# Patient Record
Sex: Female | Born: 1945
Health system: Southern US, Community
[De-identification: ages and names within clinical notes are randomized; demographics above are authoritative.]

## PROBLEM LIST (undated history)

## (undated) DIAGNOSIS — I499 Cardiac arrhythmia, unspecified: Secondary | ICD-10-CM

## (undated) DIAGNOSIS — I208 Other forms of angina pectoris: Secondary | ICD-10-CM

## (undated) DIAGNOSIS — I341 Nonrheumatic mitral (valve) prolapse: Secondary | ICD-10-CM

## (undated) DIAGNOSIS — M810 Age-related osteoporosis without current pathological fracture: Secondary | ICD-10-CM

## (undated) DIAGNOSIS — D229 Melanocytic nevi, unspecified: Secondary | ICD-10-CM

## (undated) DIAGNOSIS — Z86010 Personal history of colonic polyps: Secondary | ICD-10-CM

## (undated) DIAGNOSIS — C801 Malignant (primary) neoplasm, unspecified: Secondary | ICD-10-CM

## (undated) HISTORY — DX: Nonrheumatic mitral (valve) prolapse: I34.1

## (undated) HISTORY — PX: COLONOSCOPY: SHX174

## (undated) HISTORY — DX: Personal history of colonic polyps: Z86.010

## (undated) HISTORY — PX: TONSILLECTOMY: SUR1361

## (undated) HISTORY — DX: Melanocytic nevi, unspecified: D22.9

## (undated) HISTORY — PX: POLYPECTOMY: SHX149

## (undated) HISTORY — DX: Cardiac arrhythmia, unspecified: I49.9

## (undated) HISTORY — DX: Other forms of angina pectoris: I20.8

## (undated) HISTORY — DX: Age-related osteoporosis without current pathological fracture: M81.0

---

## 1993-03-12 HISTORY — PX: BREAST BIOPSY: SHX20

## 1998-08-02 ENCOUNTER — Other Ambulatory Visit: Admission: RE | Admit: 1998-08-02 | Discharge: 1998-08-02 | Payer: Self-pay | Admitting: Obstetrics and Gynecology

## 1999-04-07 ENCOUNTER — Encounter (INDEPENDENT_AMBULATORY_CARE_PROVIDER_SITE_OTHER): Payer: Self-pay

## 1999-04-07 ENCOUNTER — Other Ambulatory Visit: Admission: RE | Admit: 1999-04-07 | Discharge: 1999-04-07 | Payer: Self-pay | Admitting: Obstetrics and Gynecology

## 1999-09-05 ENCOUNTER — Other Ambulatory Visit: Admission: RE | Admit: 1999-09-05 | Discharge: 1999-09-05 | Payer: Self-pay | Admitting: Obstetrics and Gynecology

## 2000-04-15 ENCOUNTER — Ambulatory Visit (HOSPITAL_COMMUNITY): Admission: RE | Admit: 2000-04-15 | Discharge: 2000-04-15 | Payer: Self-pay | Admitting: Gastroenterology

## 2000-10-01 ENCOUNTER — Other Ambulatory Visit: Admission: RE | Admit: 2000-10-01 | Discharge: 2000-10-01 | Payer: Self-pay | Admitting: Obstetrics and Gynecology

## 2001-10-27 ENCOUNTER — Encounter: Payer: Self-pay | Admitting: Cardiology

## 2001-10-27 ENCOUNTER — Emergency Department (HOSPITAL_COMMUNITY): Admission: EM | Admit: 2001-10-27 | Discharge: 2001-10-27 | Payer: Self-pay | Admitting: Emergency Medicine

## 2001-11-17 ENCOUNTER — Other Ambulatory Visit: Admission: RE | Admit: 2001-11-17 | Discharge: 2001-11-17 | Payer: Self-pay | Admitting: Obstetrics and Gynecology

## 2002-11-23 ENCOUNTER — Other Ambulatory Visit: Admission: RE | Admit: 2002-11-23 | Discharge: 2002-11-23 | Payer: Self-pay | Admitting: Obstetrics and Gynecology

## 2005-08-17 DIAGNOSIS — I2089 Other forms of angina pectoris: Secondary | ICD-10-CM

## 2005-08-17 DIAGNOSIS — I208 Other forms of angina pectoris: Secondary | ICD-10-CM

## 2005-08-17 HISTORY — DX: Other forms of angina pectoris: I20.8

## 2005-08-17 HISTORY — DX: Other forms of angina pectoris: I20.89

## 2007-10-22 DIAGNOSIS — D229 Melanocytic nevi, unspecified: Secondary | ICD-10-CM

## 2007-10-22 HISTORY — DX: Melanocytic nevi, unspecified: D22.9

## 2009-02-09 DIAGNOSIS — I499 Cardiac arrhythmia, unspecified: Secondary | ICD-10-CM

## 2009-02-09 HISTORY — DX: Cardiac arrhythmia, unspecified: I49.9

## 2009-07-18 ENCOUNTER — Encounter (INDEPENDENT_AMBULATORY_CARE_PROVIDER_SITE_OTHER): Payer: Self-pay | Admitting: *Deleted

## 2009-07-20 ENCOUNTER — Encounter (INDEPENDENT_AMBULATORY_CARE_PROVIDER_SITE_OTHER): Payer: Self-pay | Admitting: *Deleted

## 2009-07-21 ENCOUNTER — Ambulatory Visit: Payer: Self-pay | Admitting: Internal Medicine

## 2009-08-18 ENCOUNTER — Ambulatory Visit: Payer: Self-pay | Admitting: Internal Medicine

## 2009-08-21 ENCOUNTER — Encounter: Payer: Self-pay | Admitting: Internal Medicine

## 2009-08-25 ENCOUNTER — Telehealth: Payer: Self-pay | Admitting: Internal Medicine

## 2010-02-13 ENCOUNTER — Encounter: Admission: RE | Admit: 2010-02-13 | Discharge: 2010-02-13 | Payer: Self-pay | Admitting: Family Medicine

## 2010-04-11 NOTE — Letter (Signed)
Summary: Moviprep Instructions  [facility]  [address]   [place], [place] [address]   Phone: [phone]  Fax: [phone]       [name]    03-28-1945    MRN: [id number]        Procedure Day Dorna Bloom: Thursday, 08-18-09     Arrival Time: 8:30 a.m.      Procedure Time: 9:30 a.m.     Location of Procedure:                    x   [facility] (4th Floor)                        PREPARATION FOR COLONOSCOPY WITH MOVIPREP   Starting 5 days prior to your procedure 08-13-09  do not eat nuts, seeds, popcorn, corn, beans, peas,  salads, or any raw vegetables.  Do not take any fiber supplements (e.g. Metamucil, Citrucel, and Benefiber).  THE DAY BEFORE YOUR PROCEDURE         DATE: 08-17-09   DAY: Wednesday  1.  Drink clear liquids the entire day-NO SOLID FOOD  2.  Do not drink anything colored red or purple.  Avoid juices with pulp.  No orange juice.  3.  Drink at least 64 oz. (8 glasses) of fluid/clear liquids during the day to prevent dehydration and help the prep work efficiently.  CLEAR LIQUIDS INCLUDE: Water Jello Ice Popsicles Tea (sugar ok, no milk/cream) Powdered fruit flavored drinks Coffee (sugar ok, no milk/cream) Gatorade Juice: apple, white grape, white cranberry  Lemonade Clear bullion, consomm, broth Carbonated beverages (any kind) Strained chicken noodle soup Hard Candy                             4.  In the morning, mix first dose of MoviPrep solution:    Empty 1 Pouch A and 1 Pouch B into the disposable container    Add lukewarm drinking water to the top line of the container. Mix to dissolve    Refrigerate (mixed solution should be used within 24 hrs)  5.  Begin drinking the prep at 5:00 p.m. The MoviPrep container is divided by 4 marks.   Every 15 minutes drink the solution down to the next mark (approximately 8 oz) until the full liter is complete.   6.  Follow completed prep with 16 oz of clear liquid of your choice  (Nothing red or purple).  Continue to drink clear liquids until bedtime.  7.  Before going to bed, mix second dose of MoviPrep solution:    Empty 1 Pouch A and 1 Pouch B into the disposable container    Add lukewarm drinking water to the top line of the container. Mix to dissolve    Refrigerate THE DAY OF YOUR PROCEDURE      DATE: 08-18-09   DAY:  Thursday  Beginning at 4:30 a.m. (5 hours before procedure):         1. Every 15 minutes, drink the solution down to the next mark (approx 8 oz) until the full liter is complete.  2. Follow completed prep with 16 oz. of clear liquid of your choice.    3. You may drink clear liquids until  6:30 a.m.  (2 HOURS BEFORE PROCEDURE).   MEDICATION INSTRUCTIONS  Unless otherwise instructed, you should take regular prescription medications with a small sip of water  as early as possible the morning of your procedure.           OTHER INSTRUCTIONS  You will need a responsible adult at least 67 years of age to accompany you and drive you home.   This person must remain in the waiting room during your procedure.  Wear loose fitting clothing that is easily removed.  Leave jewelry and other valuables at home.  However, you may wish to bring a book to read or  an iPod/MP3 player to listen to music as you wait for your procedure to start.  Remove all body piercing jewelry and leave at home.  Total time from sign-in until discharge is approximately 2-3 hours.  You should go home directly after your procedure and rest.  You can resume normal activities the  day after your procedure.  The day of your procedure you should not:   Drive   Make legal decisions   Operate machinery   Drink alcohol   Return to work  You will receive specific instructions about eating, activities and medications before you leave.    The above instructions have been reviewed and explained to me by   Clide Cliff, RN______________________    I fully  understand and can verbalize these instructions _____________________________ Date _________

## 2010-04-11 NOTE — Miscellaneous (Signed)
Summary: previsit  Clinical Lists Changes  Medications: Added new medication of MOVIPREP 100 GM  SOLR (PEG-KCL-NACL-NASULF-NA ASC-C) As directed - Signed Rx of MOVIPREP 100 GM  SOLR (PEG-KCL-NACL-NASULF-NA ASC-C) As directed;  #1 x 0;  Signed;  Entered by: Clide Cliff RN;  Authorized by: Iva Boop MD, [facility];  Method used: Electronically to [facility]  [id number]*, [address] [place], [place], [place]  [address], Ph: [phone] or [phone], Fax: [phone] Observations: Added new observation of ALLERGY REV: Done (07/21/2009 15:37)    Prescriptions: MOVIPREP 100 GM  SOLR (PEG-KCL-NACL-NASULF-NA ASC-C) As directed  #1 x 0   Entered by:   Clide Cliff RN   Authorized by:   Iva Boop MD, [facility]   Signed by:   Clide Cliff RN on 07/21/2009   Method used:   Electronically to        [facility]  [phone]* (retail)       [address]       [place], [place]  [address]       Ph: [phone] or [phone]       Fax: [phone]   RxID:   [id number]

## 2010-04-11 NOTE — Procedures (Signed)
Summary: Colonoscopy  Patient: [name] Note: All result statuses are Final unless otherwise noted.  Tests: (1) Colonoscopy (COL)   COL Colonoscopy           DONE     [facility]     [address]     [place], [place]  [address]           COLONOSCOPY PROCEDURE REPORT           PATIENT:  Kristina, [name]  MR#:  [id number]     BIRTHDATE:  15-Aug-1945, 63 yrs. old  GENDER:  female     ENDOSCOPIST:  Iva Boop, MD, [facility]     REF. BY:  Merri Brunette, M.D.     PROCEDURE DATE:  08/18/2009     PROCEDURE:  Colonoscopy 19[address]     ASA CLASS:  Class II     INDICATIONS:  Routine Risk Screening     MEDICATIONS:   Fentanyl 50 mcg IV, Versed 5 mg IV           DESCRIPTION OF PROCEDURE:   After the risks benefits and     alternatives of the procedure were thoroughly explained, informed     consent was obtained.  Digital rectal exam was performed and     revealed no rectal masses and decreased sphincter tone.   The LB     [id number]-[id number] endoscope was introduced through the anus and     advanced to the cecum, which was identified by both the appendix     and ileocecal valve, without limitations.  The quality of the prep     was excellent, using MoviPrep.  The instrument was then slowly     withdrawn as the colon was fully examined.     Insertion: 5:42 minutes Withdrawal: 11:31 minutes     <<PROCEDUREIMAGES>>           FINDINGS:  A diminutive polyp was found in the ascending colon. It     was 3 - 4 mm in size. Seen on right colon retroflexion. Polyp was     snared without cautery. Retrieval was successful.  This was     otherwise a normal examination of the colon. The colon was     somewhat redundant.   Retroflexed views in the rectum revealed     internal hemorrhoids.    The scope was then withdrawn from the     patient and the procedure completed.           COMPLICATIONS:  None     ENDOSCOPIC IMPRESSION:     1) 3 - 4 mm diminutive polyp in the ascending colon - removed   2) Internal hemorrhoids     3) Otherwise normal examination, excellemt prep           REPEAT EXAM:  In for Colonoscopy, pending biopsy results.           Iva Boop, MD, [facility]           CC:  Merri Brunette, MD and The Patient           n.     eSIGNED:   Iva Boop at 08/18/2009 10:36 AM           Morrell Riddle, [id number]  Note: An exclamation mark (!) indicates a result that was not dispersed into the flowsheet. Document Creation Date: 08/18/2009 10:37 AM _______________________________________________________________________  (1) Order result status: Final Collection or observation date-time: 08/18/2009 10:28  Requested date-time:  Receipt date-time:  Reported date-time:  Referring Physician:   Ordering Physician: Stan Head [id number]) Specimen Source:  Source: Launa Grill Order Number: [id number] Lab site:   Appended Document: Colonoscopy colon recall 5 yrs

## 2010-04-11 NOTE — Progress Notes (Signed)
Summary: Results of her Colon done 08-18-09  Phone Note Call from Patient Call back at Work Phone [phone]   Call For: Dr Leone Payor Summary of Call: Wants her results of the polyp Initial call taken by: Leanor Kail [facility],  August 25, 2009 8:27 AM  Follow-up for Phone Call        given path. results. Follow-up by: Teryl Lucy RN,  August 25, 2009 8:38 AM

## 2010-04-11 NOTE — Letter (Signed)
Summary: Patient Notice- Polyp Results   Gastroenterology  [address] [place], [place] [address]   Phone: [phone]  Fax: [phone]        August 21, 2009 MRN: [id number]    [name] [address] [place], [place]  [address]    Dear Ms. Kutch,  The polyp removed from your colon was adenomatous. This means that it was pre-cancerous or that  it had the potential to change into cancer over time.  I recommend that you have a repeat colonoscopy in 5 years to determine if you have developed any new polyps over time. If you develop any new rectal bleeding, abdominal pain or significant bowel habit changes, please contact us before then.  Please call us if you are having persistent problems or have questions about your condition that have not been fully answered at this time.  Sincerely,  Iva Boop MD, [facility]  This letter has been electronically signed by your physician.  Appended Document: Patient Notice- Polyp Results letter mailed.

## 2010-04-11 NOTE — Letter (Signed)
Summary: Previsit letter  [facility]  [address] [place], [place] [address]   Phone: [phone]  Fax: [phone]       07/18/2009 MRN: [id number]  [facility] [address] [place], [place]  [address]  Dear [name],  Welcome to the Gastroenterology Division at [facility]    You are scheduled to see a nurse for your pre-procedure visit on 07/21/2009 at 4:00PM on the 3rd floor at [facility], [address]  We ask that you try to arrive at our office 15 minutes prior to your appointment time to allow for check-in.  Your nurse visit will consist of discussing your medical and surgical history, your immediate family medical history, and your medications.    Please bring a complete list of all your medications or, if you prefer, bring the medication bottles and we will list them.  We will need to be aware of both prescribed and over the counter drugs.  We will need to know exact dosage information as well.  If you are on blood thinners (Coumadin, Plavix, Aggrenox, Ticlid, etc.) please call our office today/prior to your appointment, as we need to consult with your physician about holding your medication.   Please be prepared to read and sign documents such as consent forms, a financial agreement, and acknowledgement forms.  If necessary, and with your consent, a friend or relative is welcome to sit-in on the nurse visit with you.  Please bring your insurance card so that we may make a copy of it.  If your insurance requires a referral to see a specialist, please bring your referral form from your primary care physician.  No co-pay is required for this nurse visit.     If you cannot keep your appointment, please call [phone] to cancel or reschedule prior to your appointment date.  This allows [place] the opportunity to schedule an appointment for another patient in need of care.    Thank you for choosing [facility] for your medical  needs.  We appreciate the opportunity to care for you.  Please visit [place] at our website  to learn more about our practice.                     Sincerely.                                                                                                                   The Gastroenterology Division

## 2010-07-28 NOTE — Procedures (Signed)
[place]  Patient:    [name], [name]                        MRN: [id number] Proc. Date: 04/15/00 Attending:  Verlin Grills, M.D. CC:         Eliberto Ivory. Rosalio Macadamia, M.D.   Procedure Report  DATE OF BIRTH:  1945-08-11  REFERRING PHYSICIAN:  PROCEDURE PERFORMED:  Colonoscopy.  ENDOSCOPIST:  Verlin Grills, M.D.  INDICATIONS FOR PROCEDURE:  The patient is a 65 year old female.  She underwent her health maintenance gynecologic exam performed by Dr. Floyde Parkins.  Her stool was Hemoccult positive.  I discussed with the patient the complications associated with colonoscopy and polypectomy including a 1 per 1000 risk of bleeding and 4 per 1000 risk of colon rupture requiring emergency surgery.  The patient has signed the operative permit.  PREMEDICATION:  Demerol 50 mg, Versed 7 mg.  ENDOSCOPE:  Olympus pediatric colonoscope.  DESCRIPTION OF PROCEDURE:  After obtaining informed consent, the patient was placed in the left lateral decubitus position.  I administered intravenous Demerol and intravenous Versed to achieve sedation for the procedure.  The patients blood pressure, oxygen saturation and cardiac rhythm were monitored throughout the procedure and documented in the medical record.  Anal inspection was normal.  Digital rectal exam was normal.  The Olympus pediatric video colonoscope was then introduced into the rectum and under direct vision, advanced to the cecum as identified by a normal-appearing ileocecal valve.  Colonic preparation for the exam today was excellent.  Rectum:  Normal.  Sigmoid colon:  Normal.  Descending colon:  Normal.  Splenic flexure:  Normal.  Transverse colon:  Normal.  Hepatic flexure:  Normal.  Ascending colon:  Normal.  Cecum and ileocecal valve:  Normal.  ASSESSMENT:  Normal proctocolonoscopy to the cecum.  I find no endoscopic evidence for the presence of colorectal  neoplasia.  RECOMMENDATIONS:  Repeat colonoscopy in approximately 10 years. DD:  04/15/00 TD:  04/15/00 Job: [id number]

## 2011-06-19 DIAGNOSIS — N6009 Solitary cyst of unspecified breast: Secondary | ICD-10-CM | POA: Diagnosis not present

## 2011-06-21 DIAGNOSIS — R002 Palpitations: Secondary | ICD-10-CM | POA: Diagnosis not present

## 2011-06-21 DIAGNOSIS — I498 Other specified cardiac arrhythmias: Secondary | ICD-10-CM | POA: Diagnosis not present

## 2011-08-29 DIAGNOSIS — T148 Other injury of unspecified body region: Secondary | ICD-10-CM | POA: Diagnosis not present

## 2011-08-29 DIAGNOSIS — W57XXXA Bitten or stung by nonvenomous insect and other nonvenomous arthropods, initial encounter: Secondary | ICD-10-CM | POA: Diagnosis not present

## 2011-10-11 DIAGNOSIS — M7750 Other enthesopathy of unspecified foot: Secondary | ICD-10-CM | POA: Diagnosis not present

## 2011-12-27 DIAGNOSIS — H43819 Vitreous degeneration, unspecified eye: Secondary | ICD-10-CM | POA: Diagnosis not present

## 2011-12-27 DIAGNOSIS — H2510 Age-related nuclear cataract, unspecified eye: Secondary | ICD-10-CM | POA: Diagnosis not present

## 2011-12-27 DIAGNOSIS — D3130 Benign neoplasm of unspecified choroid: Secondary | ICD-10-CM | POA: Diagnosis not present

## 2012-02-18 DIAGNOSIS — Z23 Encounter for immunization: Secondary | ICD-10-CM | POA: Diagnosis not present

## 2012-02-18 DIAGNOSIS — I498 Other specified cardiac arrhythmias: Secondary | ICD-10-CM | POA: Diagnosis not present

## 2012-02-18 DIAGNOSIS — Z136 Encounter for screening for cardiovascular disorders: Secondary | ICD-10-CM | POA: Diagnosis not present

## 2012-02-18 DIAGNOSIS — Z1331 Encounter for screening for depression: Secondary | ICD-10-CM | POA: Diagnosis not present

## 2012-02-18 DIAGNOSIS — Z0000 Encounter for general adult medical examination without abnormal findings: Secondary | ICD-10-CM | POA: Diagnosis not present

## 2012-05-04 ENCOUNTER — Encounter: Payer: Self-pay | Admitting: *Deleted

## 2012-07-01 DIAGNOSIS — I498 Other specified cardiac arrhythmias: Secondary | ICD-10-CM | POA: Diagnosis not present

## 2012-07-10 DIAGNOSIS — Z124 Encounter for screening for malignant neoplasm of cervix: Secondary | ICD-10-CM | POA: Diagnosis not present

## 2012-07-10 DIAGNOSIS — Z01419 Encounter for gynecological examination (general) (routine) without abnormal findings: Secondary | ICD-10-CM | POA: Diagnosis not present

## 2012-09-15 DIAGNOSIS — N6489 Other specified disorders of breast: Secondary | ICD-10-CM | POA: Diagnosis not present

## 2013-07-02 ENCOUNTER — Ambulatory Visit (INDEPENDENT_AMBULATORY_CARE_PROVIDER_SITE_OTHER): Payer: Medicare Other | Admitting: Cardiovascular Disease

## 2013-07-02 ENCOUNTER — Encounter: Payer: Self-pay | Admitting: Cardiovascular Disease

## 2013-07-02 VITALS — BP 108/62 | HR 59 | Ht 67.0 in | Wt <= 1120 oz

## 2013-07-02 DIAGNOSIS — Z13220 Encounter for screening for lipoid disorders: Secondary | ICD-10-CM | POA: Diagnosis not present

## 2013-07-02 DIAGNOSIS — I471 Supraventricular tachycardia: Secondary | ICD-10-CM

## 2013-07-02 DIAGNOSIS — I059 Rheumatic mitral valve disease, unspecified: Secondary | ICD-10-CM | POA: Diagnosis not present

## 2013-07-02 DIAGNOSIS — I340 Nonrheumatic mitral (valve) insufficiency: Secondary | ICD-10-CM

## 2013-07-02 DIAGNOSIS — I341 Nonrheumatic mitral (valve) prolapse: Secondary | ICD-10-CM

## 2013-07-02 DIAGNOSIS — R002 Palpitations: Secondary | ICD-10-CM | POA: Diagnosis not present

## 2013-07-02 MED ORDER — METOPROLOL SUCCINATE ER 25 MG PO TB24: 25.0000 mg | ORAL_TABLET | Freq: Every day | ORAL | Status: DC

## 2013-07-02 NOTE — Progress Notes (Signed)
Patient ID: [name], female   DOB: 02/25/1946, 68 y.o.   MRN: [id number]     HPI: [name] is a 68 y.o. female who presents for 1 year cardiology evaluation.  [name] has a history of supraventricular tachycardia and has been treated with beta blocker therapy with Toprol-XL 37.5 mg daily.  An echo Doppler study in 2010 showed normal systolic function.  She did have mild mitral regurgitation.  Over the past year, she has remained fairly active.  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  She denies shortness of breath.  She is unaware of recurrent arrhythmia.  She denies chest pressure.  She presents for evaluation.  She has not had laboratories done in over a year.  Past Medical History  Diagnosis Date  . Dysrhythmia 02/09/2009    history of SVT ; Echo normal systolic and diastolic function ,XQ=>11%  . Atypical angina 08/17/2005    Exercise stress test-EF74% ,normal perfusion  . Mitral valve prolapse     history mild MVP    History reviewed. No pertinent past surgical history.  No Known Allergies  Current Outpatient Prescriptions  Medication Sig Dispense Refill  . Calcium-Vitamin D-Vitamin K (VIACTIV PO) Take by mouth.      . cholecalciferol (VITAMIN D) 1000 UNITS tablet Take 1,000 Units by mouth daily.      . metoprolol succinate (TOPROL-XL) 25 MG 24 hr tablet Take 1 tablet (25 mg total) by mouth daily. Take 1 and 1/2 tablets by mouth  daily  135 tablet  11   No current facility-administered medications for this visit.    History   Social History  . Marital Status: Married    Spouse Name: N/A    Number of Children: 2  . Years of Education: N/A   Occupational History  . Not on file.   Social History Main Topics  . Smoking status: Never Smoker   . Smokeless tobacco: Never Used  . Alcohol Use: Yes     Comment: occas glass of wine  . Drug Use: Not on file  . Sexual Activity: Not on file   Other Topics Concern  . Not on file   Social  History Narrative  . 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  She is married, has 2 children.  She does exercise fairly regularly.  She drinks occasional alcohol.  No tobacco use.  Family History  Problem Relation Age of Onset  . Heart disease Father     ROS is negative for fevers, chills or night sweats.  She denies change in skin.  She denies vision changes or hearing loss.  There is no lymphadenopathy.  She is unaware of wheezing.  She denies shortness of breath.  There is no PND or orthopnea.  She denies pre-syncope or syncope.  She is unaware of palpitations.  There is no chest pressure.  He denies nausea, vomiting, or diarrhea.  She denies blood in stool or urine.  She denies claudication.  There is no edema.  She denies neurologic symptoms.  She denies musculoskeletal symptoms.  There is no diabetes or thyroid disease. Other comprehensive 14 point system review is negative.  PE BP 108/62  Pulse 59  Ht 5\' 7"  (1.702 m)  Wt 13 lb 1.6 oz (5.942 kg)  BMI 2.05 kg/m2  General: Alert, oriented, no distress.  Skin: normal turgor, no rashes HEENT: Normocephalic, atraumatic. Pupils round and reactive; sclera anicteric;no lid  lag. Extraocular muscles intact;; no xanthelasmas. Nose without nasal septal hypertrophy Mouth/Parynx benign; Mallinpatti scale 2 Neck: No JVD, no carotid bruits; normal carotid upstroke Lungs: clear to ausculatation and percussion; no wheezing or rales Chest wall: no tenderness to palpitation Heart: RRR, s1 s2 normal; 2/6 systolic murmur at the apex with an intermittent systolic click.  no diastolic murmur, rub thrills or heaves Abdomen: soft, nontender; no hepatosplenomehaly, BS+; abdominal aorta nontender and not dilated by palpation. Back: no CVA tenderness Pulses 2+ Extremities: no clubbing cyanosis or edema, Homan's sign negative  Neurologic: grossly nonfocal; cranial nerves grossly normal. Psychologic: normal affect  and mood.  ECG (independently read by me): Sinus rhythm at 59 beats per minute.  Nondiagnostic T changes V1, V2.  Normal intervals.  LABS:  BMET No results found for this basename: na, k, cl, co2, glucose, bun, creatinine, calcium, gfrnonaa, gfraa     Hepatic Function Panel  No results found for this basename: prot, albumin, ast, alt, alkphos, bilitot, bilidir, ibili     CBC No results found for this basename: wbc, rbc, hgb, hct, plt, mcv, mch, mchc, rdw, neutrabs, lymphsabs, monoabs, eosabs, basosabs     BNP No results found for this basename: probnp    Lipid Panel  No results found for this basename: chol, trig, hdl, cholhdl, vldl, ldlcalc     RADIOLOGY: No results found.    ASSESSMENT AND PLAN:  [name] is a very pleasant 68 year old female who has a history of previously documented supraventricular tachycardia, which has been well controlled with Toprol-XL 37.5 mg.  She is retired.  She is able to exercise fairly regularly.  She denies change in symptoms.  Her last echo Doppler study was in December 2010.  She did have mild mitral valve regurgitation.  She does have over 6 murmur suggestive of MR at the apex with intermittent systolic click.  I recommended complete her laboratory be checked presently.  In one year, she will undergo a five-year followup echo Doppler study to followup of her mitral regurgitation, and I will see her back in the office for follow up evaluation.    Troy Sine, MD, [facility]  07/02/2013 7:01 PM

## 2013-07-02 NOTE — Patient Instructions (Signed)
Your physician recommends that you return for lab work in fasting.  Your physician has requested that you have an echocardiogram. Echocardiography is a painless test that uses sound waves to create images of your heart. It provides your doctor with information about the size and shape of your heart and how well your heart's chambers and valves are working. This procedure takes approximately one hour. There are no restrictions for this procedure.  this will be done in 1 year with follow up appointment afterwards.

## 2013-07-03 DIAGNOSIS — R002 Palpitations: Secondary | ICD-10-CM | POA: Diagnosis not present

## 2013-07-03 DIAGNOSIS — Z13220 Encounter for screening for lipoid disorders: Secondary | ICD-10-CM | POA: Diagnosis not present

## 2013-07-03 LAB — COMPREHENSIVE METABOLIC PANEL
ALT: 14 U/L (ref 0–35)
AST: 19 U/L (ref 0–37)
Albumin: 4.2 g/dL (ref 3.5–5.2)
Alkaline Phosphatase: 98 U/L (ref 39–117)
BILIRUBIN TOTAL: 2 mg/dL — AB (ref 0.2–1.2)
BUN: 18 mg/dL (ref 6–23)
CO2: 29 meq/L (ref 19–32)
CREATININE: 0.79 mg/dL (ref 0.50–1.10)
Calcium: 9.6 mg/dL (ref 8.4–10.5)
Chloride: 100 mEq/L (ref 96–112)
GLUCOSE: 87 mg/dL (ref 70–99)
Potassium: 4.4 mEq/L (ref 3.5–5.3)
Sodium: 141 mEq/L (ref 135–145)
TOTAL PROTEIN: 6.7 g/dL (ref 6.0–8.3)

## 2013-07-03 LAB — CBC
HEMATOCRIT: 43 % (ref 36.0–46.0)
HEMOGLOBIN: 14.9 g/dL (ref 12.0–15.0)
MCH: 30.8 pg (ref 26.0–34.0)
MCHC: 34.7 g/dL (ref 30.0–36.0)
MCV: 89 fL (ref 78.0–100.0)
Platelets: 215 10*3/uL (ref 150–400)
RBC: 4.83 MIL/uL (ref 3.87–5.11)
RDW: 12.9 % (ref 11.5–15.5)
WBC: 6.6 10*3/uL (ref 4.0–10.5)

## 2013-07-03 LAB — TSH: TSH: 1.841 u[IU]/mL (ref 0.350–4.500)

## 2013-07-03 LAB — LIPID PANEL
CHOL/HDL RATIO: 2.7 ratio
CHOLESTEROL: 195 mg/dL (ref 0–200)
HDL: 72 mg/dL (ref >39)
LDL Cholesterol: 113 mg/dL — ABNORMAL HIGH (ref 0–99)
TRIGLYCERIDES: 52 mg/dL (ref <150)
VLDL: 10 mg/dL (ref 0–40)

## 2013-07-08 ENCOUNTER — Telehealth: Payer: Self-pay | Admitting: *Deleted

## 2013-07-08 NOTE — Telephone Encounter (Signed)
Patient returned a call to me. Lab results with Dr. Evette Georges recommendations given to patient. Patient voiced understanding and requested a copy of the lab results be mailed to her @ her home address.

## 2013-08-04 ENCOUNTER — Telehealth: Payer: Self-pay | Admitting: Cardiovascular Disease

## 2013-08-04 NOTE — Telephone Encounter (Signed)
Medicare denying paying on lipid testing.  They suggested she call Dr Claiborne Billings office and have coded as medically necessary.  Please call

## 2013-08-04 NOTE — Telephone Encounter (Signed)
FORWARD TO Mariann Laster CMA /DR Claiborne Billings

## 2013-08-05 NOTE — Telephone Encounter (Signed)
Dr. Claiborne Billings is there any other code we can use for the patient's lipid panel other than screening lipid?

## 2013-08-06 NOTE — Telephone Encounter (Signed)
FH for heart disease

## 2013-08-07 NOTE — Telephone Encounter (Signed)
Suanne Marker can you handle this issue or direct it to the appropriate person?

## 2013-08-19 ENCOUNTER — Telehealth: Payer: Self-pay | Admitting: Cardiovascular Disease

## 2013-08-19 NOTE — Telephone Encounter (Signed)
New Prob   Pt calling regarding upcoming cholesterol test. Please call.

## 2013-08-19 NOTE — Telephone Encounter (Signed)
Returned call to patient. She had called in end of May regarding Medicare not wanting to pay for lipid test ordered by Dr. Claiborne Billings in April (stating it was not medically necessary). A screening code was used and not accepted and patient had received another bill for this blood work and wanted to follow up on if this lab can be resubmitted with another code that may be covered by insurance.   Will defer to Mariann Laster, CMA and [place] (billing)

## 2013-08-20 NOTE — Telephone Encounter (Signed)
Spoke with Dr. Claiborne Billings and he said can use family history of CAD. Note will be forwarded to [facility]

## 2013-08-24 NOTE — Telephone Encounter (Signed)
Veda Canning left a VM informing that the primary diagnosis of CAD need to be associated with lab order/information sent to [facility]. Lanny Hurst states they do npt have anything to do with billing for the lab at our location.

## 2013-08-25 NOTE — Telephone Encounter (Signed)
Will await for the lab to send usual form for additional diagnosis code.

## 2013-09-16 ENCOUNTER — Telehealth: Payer: Self-pay | Admitting: Cardiovascular Disease

## 2013-09-16 NOTE — Telephone Encounter (Signed)
Closed encounter °

## 2013-09-21 DIAGNOSIS — Z1231 Encounter for screening mammogram for malignant neoplasm of breast: Secondary | ICD-10-CM | POA: Diagnosis not present

## 2013-10-23 ENCOUNTER — Encounter: Payer: Self-pay | Admitting: Internal Medicine

## 2014-01-07 DIAGNOSIS — Z23 Encounter for immunization: Secondary | ICD-10-CM | POA: Diagnosis not present

## 2014-02-11 DIAGNOSIS — M7040 Prepatellar bursitis, unspecified knee: Secondary | ICD-10-CM | POA: Diagnosis not present

## 2014-02-12 DIAGNOSIS — S8991XA Unspecified injury of right lower leg, initial encounter: Secondary | ICD-10-CM | POA: Diagnosis not present

## 2014-05-05 ENCOUNTER — Encounter: Payer: Self-pay | Admitting: Cardiovascular Disease

## 2014-05-05 ENCOUNTER — Ambulatory Visit (INDEPENDENT_AMBULATORY_CARE_PROVIDER_SITE_OTHER): Payer: Medicare Other | Admitting: Cardiovascular Disease

## 2014-05-05 VITALS — BP 100/60 | HR 75 | Ht 66.0 in | Wt 141.6 lb

## 2014-05-05 DIAGNOSIS — R002 Palpitations: Secondary | ICD-10-CM | POA: Diagnosis not present

## 2014-05-05 DIAGNOSIS — E785 Hyperlipidemia, unspecified: Secondary | ICD-10-CM

## 2014-05-05 DIAGNOSIS — R5383 Other fatigue: Secondary | ICD-10-CM | POA: Diagnosis not present

## 2014-05-05 DIAGNOSIS — I340 Nonrheumatic mitral (valve) insufficiency: Secondary | ICD-10-CM | POA: Diagnosis not present

## 2014-05-05 DIAGNOSIS — I471 Supraventricular tachycardia: Secondary | ICD-10-CM | POA: Diagnosis not present

## 2014-05-05 DIAGNOSIS — R079 Chest pain, unspecified: Secondary | ICD-10-CM | POA: Diagnosis not present

## 2014-05-05 DIAGNOSIS — Z79899 Other long term (current) drug therapy: Secondary | ICD-10-CM

## 2014-05-05 NOTE — Patient Instructions (Addendum)
Please follow up with Dr. Claiborne Billings in 6 weeks.  Your physician has requested that you have an exercise stress myoview. For further information please visit [email or web address]. Please follow instruction sheet, as given.  Dr. Claiborne Billings has ordered for you to have lab work done and you need to be FASTING.  Dr. Claiborne Billings has recommended that you take Over The Counter Claritin or Zyrtec  Please take your Toprol as follows: 25 MG in the morning and 12.5 MG in the evening   PLEASE SCHEDULE - ORDER FROM 4/2015Your physician has requested that you have an echocardiogram. Echocardiography is a painless test that uses sound waves to create images of your heart. It provides your doctor with information about the size and shape of your heart and how well your heart's chambers and valves are working. This procedure takes approximately one hour. There are no restrictions for this procedure.

## 2014-05-06 ENCOUNTER — Telehealth: Payer: Self-pay | Admitting: Cardiovascular Disease

## 2014-05-06 NOTE — Telephone Encounter (Signed)
Spoke w/ patient, instructions for her upcoming exercise stress test instruct her to hold toprol for 24 hrs, she is hesitant to do this due to her palpitations.   She wants to make sure she needs to hold the med.

## 2014-05-06 NOTE — Telephone Encounter (Signed)
Pt is calling in to speak with Mariann Laster about some instructions for the medications she is suppose to hold due to having a stress test next week. Please f/u with her.  Thanks

## 2014-05-07 ENCOUNTER — Encounter: Payer: Self-pay | Admitting: Cardiovascular Disease

## 2014-05-07 DIAGNOSIS — R079 Chest pain, unspecified: Secondary | ICD-10-CM | POA: Insufficient documentation

## 2014-05-07 DIAGNOSIS — R002 Palpitations: Secondary | ICD-10-CM | POA: Insufficient documentation

## 2014-05-07 NOTE — Progress Notes (Signed)
Patient ID: [name], female   DOB: [birth year], 69 y.o.   MRN: [id number]      HPI: [name] is a 69 y.o. female who presents for 10 month follow-upcardiology evaluation.  [name] has a history of supraventricular tachycardia and has been treated with beta blocker therapy with Toprol-XL 37.5 mg daily.  An echo Doppler study in 2010 showed normal systolic function.  She had mild mitral regurgitation.  She has remained fairly active.  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     recently, she is had difficulty and has been waking up at night. She has noticed [place] trip frequently. She sleeps on her back.  She cannot sleep on her side since his she does she notices palpitations.  She does snore. Remotely she had a sleep study. She also has noticed some development of left-sided chest discomfort.  She denies significant shortness of breath.  She denies presyncope or syncope.  She presents for evaluation.  Past Medical History  Diagnosis Date  . Dysrhythmia 02/09/2009    history of SVT ; Echo normal systolic and diastolic function ,ZJ=>67%  . Atypical angina 08/17/2005    Exercise stress test-EF74% ,normal perfusion  . Mitral valve prolapse     history mild MVP    History reviewed. No pertinent past surgical history.  No Known Allergies  Current Outpatient Prescriptions  Medication Sig Dispense Refill  . aspirin 81 MG tablet Take 81 mg by mouth daily.    . Calcium-Vitamin D-Vitamin K (VIACTIV PO) Take by mouth.    . cholecalciferol (VITAMIN D) 1000 UNITS tablet Take 1,000 Units by mouth daily.    . metoprolol succinate (TOPROL-XL) 25 MG 24 hr tablet Take 37.5 mg by mouth daily.     No current facility-administered medications for this visit.    History   Social History  . Marital Status: Married    Spouse Name: N/A  . Number of Children: 2  . Years of Education: N/A   Occupational History  . Not on file.   Social History Main Topics  . Smoking  status: Never Smoker   . Smokeless tobacco: Never Used  . Alcohol Use: Yes     Comment: occas glass of wine  . Drug Use: Not on file  . Sexual Activity: Not on file   Other Topics Concern  . 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  She is married, has 2 children.  She does exercise fairly regularly.  She drinks occasional alcohol.  No tobacco use.  Family History  Problem Relation Age of Onset  . Heart disease Father     ROS General: Negative; No fevers, chills, or night sweats;  HEENT:  Positive with the complaint that her "nose runs all the time"  No changes in vision or hearing, difficulty swallowing Pulmonary: Negative; No cough, wheezing, shortness of breath, hemoptysis Cardiovascular:   See history of present illness GI: Negative; No nausea, vomiting, diarrhea, or abdominal pain GU: Negative; No dysuria, hematuria, or difficulty voiding Musculoskeletal: Negative; no myalgias, joint pain, or weakness Hematologic/Oncology: Negative; no easy bruising, bleeding Endocrine: Negative; no heat/cold intolerance; no diabetes Neuro: Negative; no changes in balance, headaches Skin: Negative; No rashes or skin lesions Psychiatric: Negative; No behavioral problems, depression Sleep: Negative; No snoring, daytime sleepiness, hypersomnolence, bruxism, restless legs, hypnogognic hallucinations, no cataplexy Other comprehensive 14 point system review is negative.   PE:  BP 100/60 mmHg  Pulse 75  Ht $R'5\' 6"'iC$  (1.676 m)  Wt 141 lb 9.6 oz (64.229 kg)  BMI 22.87 kg/m2  General: Alert, oriented, no distress.  Skin: normal turgor, no rashes HEENT: Normocephalic, atraumatic. Pupils round and reactive; sclera anicteric;no lid lag. Extraocular muscles intact;; no xanthelasmas. Nose  Significant asymmetry of her nares with her left nares being very thin and narrow compared to her right. Mouth/Parynx benign; Mallinpatti scale 2 Neck:  No JVD, no carotid bruits; normal carotid upstroke Lungs: clear to ausculatation and percussion; no wheezing or rales Chest wall: no tenderness to palpitation Heart: RRR, s1 s2 normal; 2/6 systolic murmur at the apex with an intermittent systolic click; no diastolic murmur, rub thrills or heaves Abdomen: soft, nontender; no hepatosplenomehaly, BS+; abdominal aorta nontender and not dilated by palpation. Back: no CVA tenderness Pulses 2+ Extremities: no clubbing cyanosis or edema, Homan's sign negative  Neurologic: grossly nonfocal; cranial nerves grossly normal. Psychologic: normal affect and mood.  ECG (independently read by me):  Normal sinus rhythm at 75 bpm. No significant ST segment changes.  ECG (independently read by me): Sinus rhythm at 59 beats per minute.  Nondiagnostic T changes V1, V2.  Normal intervals.  LABS:  BMET  BMP Latest Ref Rng 07/03/2013  Glucose 70 - 99 mg/dL 87  BUN 6 - 23 mg/dL 18  Creatinine 0.50 - 1.10 mg/dL 0.79  Sodium 135 - 145 mEq/L 141  Potassium 3.5 - 5.3 mEq/L 4.4  Chloride 96 - 112 mEq/L 100  CO2 19 - 32 mEq/L 29  Calcium 8.4 - 10.5 mg/dL 9.6    Hepatic Function Panel   Hepatic Function Latest Ref Rng 07/03/2013  Total Protein 6.0 - 8.3 g/dL 6.7  Albumin 3.5 - 5.2 g/dL 4.2  AST 0 - 37 U/L 19  ALT 0 - 35 U/L 14  Alk Phosphatase 39 - 117 U/L 98  Total Bilirubin 0.2 - 1.2 mg/dL 2.0(H)     CBC  CBC Latest Ref Rng 07/03/2013  WBC 4.0 - 10.5 K/uL 6.6  Hemoglobin 12.0 - 15.0 g/dL 14.9  Hematocrit 36.0 - 46.0 % 43.0  Platelets 150 - 400 K/uL 215     Lipid Panel     Component Value Date/Time   CHOL 195 07/03/2013 0818   TRIG 52 07/03/2013 0818   HDL 72 07/03/2013 0818   CHOLHDL 2.7 07/03/2013 0818   VLDL 10 07/03/2013 0818   LDLCALC 113* 07/03/2013 0818     RADIOLOGY: No results found.    ASSESSMENT AND PLAN:  Ms. [name] is a 69 year-old female who has a history of previously documented supraventricular tachycardia.   She has been on beta blocker therapy.  Recently, she admits to noticing more palpitations if she lies on her side in trying to sleep.  I have suggested that she change her Toprol from 37.5 mm in the morning and take 25 mg in the morning and 12.5 mg before bed.  I'm also concerned that she may have significant postnasal drip and have suggested that she take Claritin or Zyrtec before going to bed. Has noticed episodes of left-sided anterior chest discomfort. She does walk. Her last nuclear stress test was 9 years ago. I have suggested a repeat exercise Myoview study to further evaluate this chest pain.  I will also suggested a follow-up echo Doppler study to reassess her mitral valve and mitral regurgitation.I will see her back in follow-up and further recommendations will be made at that time.  Time  spent: 25 minutes Troy Sine, MD, [facility]  05/07/2014 7:21 PM

## 2014-05-08 NOTE — Telephone Encounter (Signed)
Just hold for 24 hrs rather than typically 48 hrs

## 2014-05-10 DIAGNOSIS — Z79899 Other long term (current) drug therapy: Secondary | ICD-10-CM | POA: Diagnosis not present

## 2014-05-10 DIAGNOSIS — E785 Hyperlipidemia, unspecified: Secondary | ICD-10-CM | POA: Diagnosis not present

## 2014-05-10 DIAGNOSIS — R5383 Other fatigue: Secondary | ICD-10-CM | POA: Diagnosis not present

## 2014-05-10 NOTE — Telephone Encounter (Signed)
Called pt, she voiced understanding w/ given instructions.

## 2014-05-11 LAB — COMPREHENSIVE METABOLIC PANEL
ALBUMIN: 4.2 g/dL (ref 3.5–5.2)
ALT: 14 U/L (ref 0–35)
AST: 18 U/L (ref 0–37)
Alkaline Phosphatase: 103 U/L (ref 39–117)
BUN: 22 mg/dL (ref 6–23)
CHLORIDE: 102 meq/L (ref 96–112)
CO2: 28 meq/L (ref 19–32)
Calcium: 9.8 mg/dL (ref 8.4–10.5)
Creat: 0.72 mg/dL (ref 0.50–1.10)
Glucose, Bld: 81 mg/dL (ref 70–99)
Potassium: 4.6 mEq/L (ref 3.5–5.3)
Sodium: 138 mEq/L (ref 135–145)
TOTAL PROTEIN: 6.7 g/dL (ref 6.0–8.3)
Total Bilirubin: 2.2 mg/dL — ABNORMAL HIGH (ref 0.2–1.2)

## 2014-05-11 LAB — CBC
HCT: 46.9 % — ABNORMAL HIGH (ref 36.0–46.0)
HEMOGLOBIN: 15.2 g/dL — AB (ref 12.0–15.0)
MCH: 31 pg (ref 26.0–34.0)
MCHC: 32.4 g/dL (ref 30.0–36.0)
MCV: 95.5 fL (ref 78.0–100.0)
MPV: 11.5 fL (ref 8.6–12.4)
Platelets: 203 10*3/uL (ref 150–400)
RBC: 4.91 MIL/uL (ref 3.87–5.11)
RDW: 13 % (ref 11.5–15.5)
WBC: 6.7 10*3/uL (ref 4.0–10.5)

## 2014-05-11 LAB — LIPID PANEL
CHOL/HDL RATIO: 2.5 ratio
Cholesterol: 189 mg/dL (ref 0–200)
HDL: 75 mg/dL (ref >=46)
LDL CALC: 103 mg/dL — AB (ref 0–99)
TRIGLYCERIDES: 57 mg/dL (ref <150)
VLDL: 11 mg/dL (ref 0–40)

## 2014-05-11 LAB — TSH: TSH: 1.626 u[IU]/mL (ref 0.350–4.500)

## 2014-05-24 ENCOUNTER — Encounter: Payer: Self-pay | Admitting: Cardiovascular Disease

## 2014-05-25 ENCOUNTER — Telehealth (HOSPITAL_COMMUNITY): Payer: Self-pay

## 2014-05-25 NOTE — Telephone Encounter (Signed)
Encounter complete. 

## 2014-05-27 ENCOUNTER — Ambulatory Visit (HOSPITAL_COMMUNITY)
Admission: RE | Admit: 2014-05-27 | Discharge: 2014-05-27 | Disposition: A | Discharge status: Home / Self Care | Payer: Medicare Other | Source: Ambulatory Visit | Attending: Cardiology | Admitting: Cardiology

## 2014-05-27 ENCOUNTER — Ambulatory Visit (HOSPITAL_BASED_OUTPATIENT_CLINIC_OR_DEPARTMENT_OTHER)
Admission: RE | Admit: 2014-05-27 | Discharge: 2014-05-27 | Disposition: A | Discharge status: Home / Self Care | Payer: Medicare Other | Source: Ambulatory Visit | Attending: Cardiology | Admitting: Cardiology

## 2014-05-27 DIAGNOSIS — R079 Chest pain, unspecified: Secondary | ICD-10-CM

## 2014-05-27 DIAGNOSIS — R002 Palpitations: Secondary | ICD-10-CM | POA: Insufficient documentation

## 2014-05-27 DIAGNOSIS — I471 Supraventricular tachycardia: Secondary | ICD-10-CM | POA: Diagnosis not present

## 2014-05-27 DIAGNOSIS — I341 Nonrheumatic mitral (valve) prolapse: Secondary | ICD-10-CM | POA: Diagnosis not present

## 2014-05-27 MED ORDER — TECHNETIUM TC 99M SESTAMIBI GENERIC - CARDIOLITE
10.0000 | Freq: Once | INTRAVENOUS | Status: AC | PRN
  Administered 2014-05-27: 10 via INTRAVENOUS

## 2014-05-27 MED ORDER — TECHNETIUM TC 99M SESTAMIBI GENERIC - CARDIOLITE
30.0000 | Freq: Once | INTRAVENOUS | Status: AC | PRN
  Administered 2014-05-27: 30 via INTRAVENOUS

## 2014-05-27 NOTE — Progress Notes (Signed)
2D Echocardiogram Complete.  05/27/2014   [name] [place], [place]

## 2014-05-27 NOTE — Procedures (Addendum)
[address] [place] [address] [phone]  Cardiology Nuclear Med Study  [name] is [name] 69 y.o. female     MRN : [id number]     DOB: [birth year]  Procedure Date: 05/27/2014  Nuclear Med Background Indication for Stress Test:  Evaluation for Ischemia and Abnormal EKG History:  MVP and SVT;No prior respiratory history reported;NUC MPI 9 years ago--Not available in EPIC Cardiac Risk Factors: none  Symptoms:  Chest Pain, Dizziness, Light-Headedness, Palpitations and SOB   Nuclear Pre-Procedure Caffeine/Decaff Intake:  9:00pm NPO After: 5:00am   IV Site: R Forearm  IV 0.9% NS with Angio Cath:  22g  Chest Size (in):  n/[name] IV Started by: Rolene Course, RN  Height: 5\' 6"  (1.676 m)  Cup Size: C  BMI:  Body mass index is 22.77 kg/(m^2). Weight:  141 lb (63.957 kg)   Tech Comments:  n/[name]    Nuclear Med Study 1 or 2 day study: 1 day  Stress Test Type:  Stress  Order Authorizing Provider:  Shelva Majestic, MD   Resting Radionuclide: Technetium 82m Sestamibi  Resting Radionuclide Dose: 10.6 mCi   Stress Radionuclide:  Technetium 53m Sestamibi  Stress Radionuclide Dose: 30.5 mCi           Stress Protocol Rest HR: 65 Stress HR: 150  Rest BP: 129/86 Stress BP: 164/87  Exercise Time (min): 10 METS: 11.7   Predicted Max HR: 150 bpm % Max HR: 100 bpm Rate Pressure Product: 24600  Dose of Adenosine (mg):  n/[name] Dose of Lexiscan: n/[name] mg  Dose of Atropine (mg): n/[name] Dose of Dobutamine: n/[name] mcg/kg/min (at max HR)  Stress Test Technologist: Leane Para, CCT Nuclear Technologist:Elizabeth Young,CNMT   Rest Procedure:  Myocardial perfusion imaging was performed at rest 45 minutes following the intravenous administration of Technetium 17m Sestamibi. Stress Procedure:  The patient performed treadmill exercise using [name] Bruce  Protocol for 10 minutes. The patient stopped due to General Fatigue and THR and denied any chest  pain.  There were no significant ST-T wave changes.  Technetium 21m Sestamibi was injected IV at peak exercise and myocardial perfusion imaging was performed after [name] brief delay.  Transient Ischemic Dilatation (Normal <1.22):  0.94  QGS EDV:  60 ml QGS ESV:  20 ml LV Ejection Fraction: 66%     Rest ECG: NSR - Normal EKG  Stress ECG: No significant change from baseline ECG  QPS Raw Data Images:  Normal; no motion artifact; normal heart/lung ratio. Stress Images:  Normal homogeneous uptake in all areas of the myocardium. Rest Images:  Normal homogeneous uptake in all areas of the myocardium. Subtraction (SDS):  Normal  Impression Exercise Capacity:  Excellent exercise capacity. BP Response:  Normal blood pressure response. Clinical Symptoms:  No significant symptoms noted. ECG Impression:  No significant ST segment change suggestive of ischemia. Comparison with Prior Nuclear Study: No significant change from previous study  Overall Impression:  Normal stress nuclear study.  LV Wall Motion:  NL LV Function, EF 61%; NL Wall Motion   Kristina Glidden A, MD  05/27/2014 12:23 PM

## 2014-06-11 ENCOUNTER — Ambulatory Visit (INDEPENDENT_AMBULATORY_CARE_PROVIDER_SITE_OTHER): Payer: Medicare Other | Admitting: Cardiovascular Disease

## 2014-06-11 VITALS — BP 100/64 | HR 76 | Ht 66.0 in | Wt 143.6 lb

## 2014-06-11 DIAGNOSIS — I340 Nonrheumatic mitral (valve) insufficiency: Secondary | ICD-10-CM | POA: Diagnosis not present

## 2014-06-11 DIAGNOSIS — R002 Palpitations: Secondary | ICD-10-CM | POA: Diagnosis not present

## 2014-06-11 DIAGNOSIS — R17 Unspecified jaundice: Secondary | ICD-10-CM

## 2014-06-11 DIAGNOSIS — I341 Nonrheumatic mitral (valve) prolapse: Secondary | ICD-10-CM | POA: Diagnosis not present

## 2014-06-11 DIAGNOSIS — I471 Supraventricular tachycardia: Secondary | ICD-10-CM | POA: Diagnosis not present

## 2014-06-11 DIAGNOSIS — R079 Chest pain, unspecified: Secondary | ICD-10-CM

## 2014-06-11 MED ORDER — METOPROLOL SUCCINATE ER 25 MG PO TB24: 37.5000 mg | ORAL_TABLET | Freq: Every day | ORAL | Status: DC

## 2014-06-11 NOTE — Patient Instructions (Signed)
Your physician wants you to follow-up in: 1 year or sooner if needed with Dr. Kelly. You will receive a reminder letter in the mail two months in advance. If you don't receive a letter, please call our office to schedule the follow-up appointment. 

## 2014-06-12 ENCOUNTER — Encounter: Payer: Self-pay | Admitting: Cardiovascular Disease

## 2014-06-12 DIAGNOSIS — R17 Unspecified jaundice: Secondary | ICD-10-CM | POA: Insufficient documentation

## 2014-06-12 DIAGNOSIS — I341 Nonrheumatic mitral (valve) prolapse: Secondary | ICD-10-CM | POA: Insufficient documentation

## 2014-06-12 NOTE — Progress Notes (Signed)
Patient ID: [name], female   DOB: 07/28/1945, 69 y.o.   MRN: [id number]      Primary M.D.: Dr. Carol Ada  HPI: [name] is a 69 y.o. female who presents for a follow-up cardiology evaluation.  [name] has a history of SVT and has been treated with beta blocker therapy with Toprol-XL 37.5 mg daily.  An echo Doppler study in 2010 showed normal systolic function.  She had mild mitral regurgitation.  She has remained fairly active.  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    I had seen her in February 2016 and at that time she was complaining of difficulty withwaking up at night. She sleeps on her back and cannot sleep on her side since she notices palpitations.  She does snore. Remotely she had a sleep study. She also has noticed some development of left-sided chest discomfort.  She denies significant shortness of breath.  She denies presyncope or syncope.  She was very concerned about these palpitations and some episodes of chest discomfort.  She subtotally underwent an echo Doppler study on 05/27/2014.  This showed an ejection fraction at 60-65%.  There was grade 1 diastolic dysfunction.  She had documented late systolic bileaflet mitral valve prolapse with trivial mitral regurgitation.  There was mild tricuspid regurgitation.  Estimated PA pressures were normal at 22 mm.  She underwent a nuclear perfusion study which was normal.  She had excellent exercise capacity and exercise for 10 minutes in the Bruce protocol without chest pain or ECG changes.  Scintigraphic images were normal.  When I had seen her last I recommended that he change the way she was taking her metoprolol succinate and recommended that she take 25 mg in the morning and 12.5 mg at night.  She feels that this has dramatically improved her symptomatic palpitations nocturnally.  She denies daytime fatigue.  She presents for follow-up evaluation.  Past Medical History  Diagnosis Date  . Dysrhythmia  02/09/2009    history of SVT ; Echo normal systolic and diastolic function ,VQ=>00%  . Atypical angina 08/17/2005    Exercise stress test-EF74% ,normal perfusion  . Mitral valve prolapse     history mild MVP    History reviewed. No pertinent past surgical history.  No Known Allergies  Current Outpatient Prescriptions  Medication Sig Dispense Refill  . aspirin 81 MG tablet Take 81 mg by mouth daily.    . Calcium-Vitamin D-Vitamin K (VIACTIV PO) Take by mouth.    . cholecalciferol (VITAMIN D) 1000 UNITS tablet Take 1,000 Units by mouth daily.    . metoprolol succinate (TOPROL-XL) 25 MG 24 hr tablet Take 1.5 tablets (37.5 mg total) by mouth daily. 135 tablet 3   No current facility-administered medications for this visit.    History   Social History  . Marital Status: Married    Spouse Name: N/A  . Number of Children: 2  . Years of Education: N/A   Occupational History  . Not on file.   Social History Main Topics  . Smoking status: Never Smoker   . Smokeless tobacco: Never Used  . Alcohol Use: Yes     Comment: occas glass of wine  . Drug Use: Not on file  . Sexual Activity: Not on file   Other Topics Concern  . 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  She is married, has 2 children.  She does  exercise fairly regularly.  She drinks occasional alcohol.  No tobacco use.  Family History  Problem Relation Age of Onset  . Heart disease Father     ROS General: Negative; No fevers, chills, or night sweats;  HEENT:  Positive with the complaint that her "nose runs all the time"  No changes in vision or hearing, difficulty swallowing Pulmonary: Negative; No cough, wheezing, shortness of breath, hemoptysis Cardiovascular:   See history of present illness GI: Negative; No nausea, vomiting, diarrhea, or abdominal pain GU: Negative; No dysuria, hematuria, or difficulty voiding Musculoskeletal: Negative; no  myalgias, joint pain, or weakness Hematologic/Oncology: Negative; no easy bruising, bleeding Endocrine: Negative; no heat/cold intolerance; no diabetes Neuro: Negative; no changes in balance, headaches Skin: Negative; No rashes or skin lesions Psychiatric: Negative; No behavioral problems, depression Sleep: Negative; No snoring, daytime sleepiness, hypersomnolence, bruxism, restless legs, hypnogognic hallucinations, no cataplexy Other comprehensive 14 point system review is negative.   PE:  BP 100/64 mmHg  Pulse 76  Ht 5' 6"  (1.676 m)  Wt 143 lb 9.6 oz (65.137 kg)  BMI 23.19 kg/m2  General: Alert, oriented, no distress.  Skin: normal turgor, no rashes HEENT: Normocephalic, atraumatic. Pupils round and reactive; sclera anicteric;no lid lag. Extraocular muscles intact;; no xanthelasmas. Nose  Significant asymmetry of her nares with her left nares being very thin and narrow compared to her right. Mouth/Parynx benign; Mallinpatti scale 2 Neck: No JVD, no carotid bruits; normal carotid upstroke Lungs: clear to ausculatation and percussion; no wheezing or rales Chest wall: no tenderness to palpitation Heart: RRR, s1 s2 normal; 2/6 systolic murmur at the apex with an intermittent systolic click; no diastolic murmur, rub thrills or heaves Abdomen: soft, nontender; no hepatosplenomehaly, BS+; abdominal aorta nontender and not dilated by palpation. Back: no CVA tenderness Pulses 2+ Extremities: no clubbing cyanosis or edema, Homan's sign negative  Neurologic: grossly nonfocal; cranial nerves grossly normal. Psychologic: normal affect and mood.  ECG not done today since she just had a nuclear stress test.  05/05/2014 ECG (independently read by me):  Normal sinus rhythm at 75 bpm. No significant ST segment changes.  Prior ECG (independently read by me): Sinus rhythm at 59 beats per minute.  Nondiagnostic T changes V1, V2.  Normal intervals.  LABS:  BMET  BMP Latest Ref Rng 05/10/2014  07/03/2013  Glucose 70 - 99 mg/dL 81 87  BUN 6 - 23 mg/dL 22 18  Creatinine 0.50 - 1.10 mg/dL 0.72 0.79  Sodium 135 - 145 mEq/L 138 141  Potassium 3.5 - 5.3 mEq/L 4.6 4.4  Chloride 96 - 112 mEq/L 102 100  CO2 19 - 32 mEq/L 28 29  Calcium 8.4 - 10.5 mg/dL 9.8 9.6    Hepatic Function Panel   Hepatic Function Latest Ref Rng 05/10/2014 07/03/2013  Total Protein 6.0 - 8.3 g/dL 6.7 6.7  Albumin 3.5 - 5.2 g/dL 4.2 4.2  AST 0 - 37 U/L 18 19  ALT 0 - 35 U/L 14 14  Alk Phosphatase 39 - 117 U/L 103 98  Total Bilirubin 0.2 - 1.2 mg/dL 2.2(H) 2.0(H)     CBC  CBC Latest Ref Rng 05/10/2014 07/03/2013  WBC 4.0 - 10.5 K/uL 6.7 6.6  Hemoglobin 12.0 - 15.0 g/dL 15.2(H) 14.9  Hematocrit 36.0 - 46.0 % 46.9(H) 43.0  Platelets 150 - 400 K/uL 203 215     Lipid Panel     Component Value Date/Time   CHOL 189 05/10/2014 0849   TRIG 57 05/10/2014 0849   HDL  75 05/10/2014 0849   CHOLHDL 2.5 05/10/2014 0849   VLDL 11 05/10/2014 0849   LDLCALC 103* 05/10/2014 0849     RADIOLOGY: No results found.    ASSESSMENT AND PLAN:  Ms. Christelle Igoe is a 69 year-old female with previously documented supraventricular tachycardia.  She has been on beta blocker therapy recently had noticed nocturnal palpitations.  With her medication adjustment and taking Toprol-XL 25 g in the morning and 12.5 mg at night.  These essentially have resolved.  Her blood pressure today is normal, but on the low normal side.  I personally reviewed her echo Doppler study as well as her nuclear my card perfusion scan.  Her echo Doppler study confirms bileaflet late systolic mitral valve prolapse.  LV function is normal.  There is mild diastolic relaxation abnormality.  A nuclear perfusion study demonstrates good exercise tolerance with normal perfusion.  I reassured her that I do not feel that she has significant coronary obstructive disease.  She can increase her Toprol to 25 no grams twice a day if necessary.  If she does experience  recurrent nocturnal palpitation symptom otology.  I reviewed her recent laboratory.  Her LDL cholesterol is 103, which is slightly improved from one year previously.  She has normal renal function.  She is not anemic.  She has normal liver transaminases, but bilirubin is mildly increased.  If this is indirect bilirubin this raises the possibility of possible Gilbert's syndrome.  As long as she remains stable, I will see her in one year for follow-up evaluation or sooner from his arise.  Time spent: 25 minutes  Troy Sine, MD, [facility]  06/12/2014 2:19 PM

## 2014-06-24 ENCOUNTER — Ambulatory Visit: Payer: 59 | Admitting: Cardiovascular Disease

## 2014-07-05 DIAGNOSIS — Z1389 Encounter for screening for other disorder: Secondary | ICD-10-CM | POA: Diagnosis not present

## 2014-07-05 DIAGNOSIS — I471 Supraventricular tachycardia: Secondary | ICD-10-CM | POA: Diagnosis not present

## 2014-07-05 DIAGNOSIS — Z0000 Encounter for general adult medical examination without abnormal findings: Secondary | ICD-10-CM | POA: Diagnosis not present

## 2014-08-16 DIAGNOSIS — M810 Age-related osteoporosis without current pathological fracture: Secondary | ICD-10-CM | POA: Diagnosis not present

## 2014-09-28 ENCOUNTER — Encounter: Payer: Self-pay | Admitting: Internal Medicine

## 2014-11-01 DIAGNOSIS — H04129 Dry eye syndrome of unspecified lacrimal gland: Secondary | ICD-10-CM | POA: Diagnosis not present

## 2014-11-01 DIAGNOSIS — H2513 Age-related nuclear cataract, bilateral: Secondary | ICD-10-CM | POA: Diagnosis not present

## 2014-11-01 DIAGNOSIS — H25013 Cortical age-related cataract, bilateral: Secondary | ICD-10-CM | POA: Diagnosis not present

## 2014-11-01 DIAGNOSIS — H5200 Hypermetropia, unspecified eye: Secondary | ICD-10-CM | POA: Diagnosis not present

## 2014-12-14 DIAGNOSIS — Z1231 Encounter for screening mammogram for malignant neoplasm of breast: Secondary | ICD-10-CM | POA: Diagnosis not present

## 2014-12-17 DIAGNOSIS — Z1231 Encounter for screening mammogram for malignant neoplasm of breast: Secondary | ICD-10-CM | POA: Diagnosis not present

## 2014-12-17 DIAGNOSIS — R921 Mammographic calcification found on diagnostic imaging of breast: Secondary | ICD-10-CM | POA: Diagnosis not present

## 2014-12-20 DIAGNOSIS — Z23 Encounter for immunization: Secondary | ICD-10-CM | POA: Diagnosis not present

## 2015-06-13 ENCOUNTER — Other Ambulatory Visit: Payer: Self-pay | Admitting: Cardiovascular Disease

## 2015-07-06 ENCOUNTER — Encounter: Payer: Self-pay | Admitting: Cardiovascular Disease

## 2015-07-06 ENCOUNTER — Ambulatory Visit (INDEPENDENT_AMBULATORY_CARE_PROVIDER_SITE_OTHER): Payer: Medicare Other | Admitting: Cardiovascular Disease

## 2015-07-06 VITALS — BP 92/58 | HR 68 | Ht 66.0 in | Wt 139.5 lb

## 2015-07-06 DIAGNOSIS — R002 Palpitations: Secondary | ICD-10-CM

## 2015-07-06 DIAGNOSIS — I341 Nonrheumatic mitral (valve) prolapse: Secondary | ICD-10-CM | POA: Diagnosis not present

## 2015-07-06 DIAGNOSIS — I340 Nonrheumatic mitral (valve) insufficiency: Secondary | ICD-10-CM | POA: Diagnosis not present

## 2015-07-06 DIAGNOSIS — I471 Supraventricular tachycardia: Secondary | ICD-10-CM | POA: Diagnosis not present

## 2015-07-06 MED ORDER — TOPROL XL 25 MG PO TB24: 37.5000 mg | ORAL_TABLET | Freq: Every day | ORAL | Status: DC

## 2015-07-06 NOTE — Patient Instructions (Signed)
Your physician wants you to follow-up in: 1 year or sooner if needed. You will receive a reminder letter in the mail two months in advance. If you don't receive a letter, please call our office to schedule the follow-up appointment.  

## 2015-07-07 ENCOUNTER — Encounter: Payer: Self-pay | Admitting: Cardiovascular Disease

## 2015-07-07 NOTE — Progress Notes (Signed)
Patient ID: [name], female   DOB: 12/23/1945, 70 y.o.   MRN: [id number]      Primary M.D.: Dr. Carol Ada  HPI: [name] is a 70 y.o. female who presents for a one year follow-up cardiology evaluation.  [name] has a history of SVT and has been treated with beta blocker therapy with Toprol-XL 37.5 mg daily.  An echo Doppler study in 2010 showed normal systolic function.  She had mild mitral regurgitation.  She has remained fairly active.  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    When I her in February 2016 and at that time she was complaining of difficulty with waking up at night. She sleeps on her back and cannot sleep on her side since she notices palpitations.  She does snore. Remotely she had a sleep study. She also has noticed some development of left-sided chest discomfort.  She denies significant shortness of breath.  She denies presyncope or syncope.  She was very concerned about these palpitations and some episodes of chest discomfort.  She  underwent an echo Doppler study on 05/27/2014 which showed an ejection fraction at 60-65%.  There was grade 1 diastolic dysfunction.  She had documented late systolic bileaflet mitral valve prolapse with trivial mitral regurgitation.  There was mild tricuspid regurgitation.  Estimated PA pressures were normal at 22 mm.  She underwent a nuclear perfusion study which was normal.  She had excellent exercise capacity and exercise for 10 minutes in the Bruce protocol without chest pain or ECG changes.  Scintigraphic images were normal.   I recommended that he change the way she was taking her metoprolol succinate and recommended that she take 25 mg in the morning and 12.5 mg at night.  She feels that this has dramatically improved her symptomatic palpitations nocturnally.  She denies daytime fatigue.    Over the past year, she has felt well.  She specifically denies any chest pain or palpitations.  She has noticed resolution  of palpitations at night by taking the 12.5 mg evening dose.  She has remained active.  She will be going on a cruise to [place] for 2 weeks. She presents for follow-up evaluation.  Past Medical History  Diagnosis Date  . Dysrhythmia 02/09/2009    history of SVT ; Echo normal systolic and diastolic function ,IH=>47%  . Atypical angina ([place]) 08/17/2005    Exercise stress test-EF74% ,normal perfusion  . Mitral valve prolapse     history mild MVP    No past surgical history on file.  No Known Allergies  Current Outpatient Prescriptions  Medication Sig Dispense Refill  . aspirin 81 MG tablet Take 81 mg by mouth daily.    . Calcium-Vitamin D-Vitamin K (VIACTIV PO) Take by mouth.    . cholecalciferol (VITAMIN D) 1000 UNITS tablet Take 1,000 Units by mouth daily.    . TOPROL XL 25 MG 24 hr tablet Take 1.5 tablets (37.5 mg total) by mouth daily. 135 tablet 3   No current facility-administered medications for this visit.    Social History   Social History  . Marital Status: Married    Spouse Name: N/A  . Number of Children: 2  . Years of Education: N/A   Occupational History  . Not on file.   Social History Main Topics  . Smoking status: Never Smoker   . Smokeless tobacco: Never Used  . Alcohol Use: Yes     Comment: occas glass of wine  .  Drug Use: Not on file  . Sexual Activity: Not on file   Other Topics Concern  . 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  She is married, has 2 children.  She does exercise fairly regularly.  She drinks occasional alcohol.  No tobacco use.  Family History  Problem Relation Age of Onset  . Heart disease Father     ROS General: Negative; No fevers, chills, or night sweats;  HEENT:  Positive with the complaint that her "nose runs all the time"  No changes in vision or hearing, difficulty swallowing Pulmonary: Negative; No cough, wheezing, shortness of breath,  hemoptysis Cardiovascular:   See history of present illness GI: Negative; No nausea, vomiting, diarrhea, or abdominal pain GU: Negative; No dysuria, hematuria, or difficulty voiding Musculoskeletal: Negative; no myalgias, joint pain, or weakness Hematologic/Oncology: Negative; no easy bruising, bleeding Endocrine: Negative; no heat/cold intolerance; no diabetes Neuro: Negative; no changes in balance, headaches Skin: Negative; No rashes or skin lesions Psychiatric: Negative; No behavioral problems, depression Sleep: Negative; No snoring, daytime sleepiness, hypersomnolence, bruxism, restless legs, hypnogognic hallucinations, no cataplexy Other comprehensive 14 point system review is negative.   PE:  BP 92/58 mmHg  Pulse 68  Ht _0  (1.676 m)  Wt 139 lb 8 oz (63.277 kg)  BMI 22.53 kg/m2  Repeat blood pressure by me 118/70 supine and 112/70 standing  Wt Readings from Last 3 Encounters:  07/06/15 139 lb 8 oz (63.277 kg)  06/11/14 143 lb 9.6 oz (65.137 kg)  05/27/14 141 lb (63.957 kg)   General: Alert, oriented, no distress.  Skin: normal turgor, no rashes HEENT: Normocephalic, atraumatic. Pupils round and reactive; sclera anicteric;no lid lag. Extraocular muscles intact;; no xanthelasmas. Nose  Significant asymmetry of her nares with her left nares being very thin and narrow compared to her right. Mouth/Parynx benign; Mallinpatti scale 2 Neck: No JVD, no carotid bruits; normal carotid upstroke Lungs: clear to ausculatation and percussion; no wheezing or rales Chest wall: no tenderness to palpitation Heart: RRR, s1 s2 normal; 2/6 systolic murmur at the apex wiith intermittent systolic clicks; no diastolic murmur, rub thrills or heaves Abdomen: soft, nontender; no hepatosplenomehaly, BS+; abdominal aorta nontender and not dilated by palpation. Back: no CVA tenderness Pulses 2+ Extremities: no clubbing cyanosis or edema, Homan's sign negative  Neurologic: grossly nonfocal; cranial  nerves grossly normal. Psychologic: normal affect and mood.  ECG (independently read by me): Normal sinus rhythm at 65.  No ectopy.  Normal intervals.  QTC 436 ms.  05/05/2014 ECG (independently read by me):  Normal sinus rhythm at 75 bpm. No significant ST segment changes.  Prior ECG (independently read by me): Sinus rhythm at 59 beats per minute.  Nondiagnostic T changes V1, V2.  Normal intervals.  LABS:  BMP Latest Ref Rng 05/10/2014 07/03/2013  Glucose 70 - 99 mg/dL 81 87  BUN 6 - 23 mg/dL 22 18  Creatinine 0.50 - 1.10 mg/dL 0.72 0.79  Sodium 135 - 145 mEq/L 138 141  Potassium 3.5 - 5.3 mEq/L 4.6 4.4  Chloride 96 - 112 mEq/L 102 100  CO2 19 - 32 mEq/L 28 29  Calcium 8.4 - 10.5 mg/dL 9.8 9.6    Hepatic Function Latest Ref Rng 05/10/2014 07/03/2013  Total Protein 6.0 - 8.3 g/dL 6.7 6.7  Albumin 3.5 - 5.2 g/dL 4.2 4.2  AST 0 - 37 U/L 18 19  ALT 0 - 35 U/L 14 14  Alk Phosphatase 39 - 117 U/L 103 98  Total Bilirubin 0.2 - 1.2 mg/dL 2.2(H) 2.0(H)    CBC Latest Ref Rng 05/10/2014 07/03/2013  WBC 4.0 - 10.5 K/uL 6.7 6.6  Hemoglobin 12.0 - 15.0 g/dL 15.2(H) 14.9  Hematocrit 36.0 - 46.0 % 46.9(H) 43.0  Platelets 150 - 400 K/uL 203 215   Lab Results  Component Value Date   MCV 95.5 05/10/2014   MCV 89.0 07/03/2013   Lab Results  Component Value Date   TSH 1.626 05/10/2014  No results found for: HGBA1C  Lipid Panel     Component Value Date/Time   CHOL 189 05/10/2014 0849   TRIG 57 05/10/2014 0849   HDL 75 05/10/2014 0849   CHOLHDL 2.5 05/10/2014 0849   VLDL 11 05/10/2014 0849   LDLCALC 103* 05/10/2014 0849    ASSESSMENT AND PLAN: [name] is a 70 year-old female with previously documented supraventricular tachycardia.  She has been on beta blocker therapy.  It has controlled her ectopy but when last seen, she had developed nocturnal palpitations.  With her medication adjustment, assess that she is taking Toprol-XL 20 50 g in the morning and 12.5 mg at night she  has had complete resolution of her nocturnal palpitations.   Her echo Doppler study confirms bileaflet late systolic mitral valve prolapse.  She has multiple systolic clicks on exam due to her mitral valve prolapse.  LV function is normal.  There is mild diastolic relaxation abnormality.  A nuclear perfusion study demonstrated good exercise tolerance with normal perfusion.  Clinically she is doing well.  She is staying active.  Her blood pressure today is stable and on repeat by me was 118/70 supine and 112/70 standing.  She is not having orthostatic symptoms.  She sees Dr. Carol Ada, for primary care.  I will try to obtain any recent laboratory that may have been done through her office.  She will be going on her cruise  in [place] which leaves from [place] in several weeks.  I reviewed her medications.  I will see her in one year for cardiology reevaluation.  Time spent: 25 minutes  Troy Sine, MD, [facility]  07/07/2015 11:53 AM

## 2015-08-01 DIAGNOSIS — N390 Urinary tract infection, site not specified: Secondary | ICD-10-CM | POA: Diagnosis not present

## 2015-08-01 DIAGNOSIS — R319 Hematuria, unspecified: Secondary | ICD-10-CM | POA: Diagnosis not present

## 2015-08-09 DIAGNOSIS — R921 Mammographic calcification found on diagnostic imaging of breast: Secondary | ICD-10-CM | POA: Diagnosis not present

## 2015-09-06 DIAGNOSIS — Z0000 Encounter for general adult medical examination without abnormal findings: Secondary | ICD-10-CM | POA: Diagnosis not present

## 2015-09-06 DIAGNOSIS — Z23 Encounter for immunization: Secondary | ICD-10-CM | POA: Diagnosis not present

## 2015-09-06 DIAGNOSIS — M8588 Other specified disorders of bone density and structure, other site: Secondary | ICD-10-CM | POA: Diagnosis not present

## 2015-09-06 DIAGNOSIS — Z1389 Encounter for screening for other disorder: Secondary | ICD-10-CM | POA: Diagnosis not present

## 2015-09-06 DIAGNOSIS — M816 Localized osteoporosis [Lequesne]: Secondary | ICD-10-CM | POA: Diagnosis not present

## 2015-09-06 DIAGNOSIS — Z1211 Encounter for screening for malignant neoplasm of colon: Secondary | ICD-10-CM | POA: Diagnosis not present

## 2015-09-06 DIAGNOSIS — I471 Supraventricular tachycardia: Secondary | ICD-10-CM | POA: Diagnosis not present

## 2015-10-02 ENCOUNTER — Other Ambulatory Visit: Payer: Self-pay | Admitting: Cardiovascular Disease

## 2015-12-16 DIAGNOSIS — Z23 Encounter for immunization: Secondary | ICD-10-CM | POA: Diagnosis not present

## 2016-02-09 DIAGNOSIS — R921 Mammographic calcification found on diagnostic imaging of breast: Secondary | ICD-10-CM | POA: Diagnosis not present

## 2016-06-28 DIAGNOSIS — H25013 Cortical age-related cataract, bilateral: Secondary | ICD-10-CM | POA: Diagnosis not present

## 2016-06-28 DIAGNOSIS — D3132 Benign neoplasm of left choroid: Secondary | ICD-10-CM | POA: Diagnosis not present

## 2016-06-28 DIAGNOSIS — H2513 Age-related nuclear cataract, bilateral: Secondary | ICD-10-CM | POA: Diagnosis not present

## 2016-06-28 DIAGNOSIS — H43813 Vitreous degeneration, bilateral: Secondary | ICD-10-CM | POA: Diagnosis not present

## 2016-07-12 ENCOUNTER — Encounter: Payer: Self-pay | Admitting: Cardiovascular Disease

## 2016-07-12 ENCOUNTER — Ambulatory Visit (INDEPENDENT_AMBULATORY_CARE_PROVIDER_SITE_OTHER): Payer: Medicare Other | Admitting: Cardiovascular Disease

## 2016-07-12 VITALS — BP 102/68 | HR 68 | Ht 66.0 in | Wt 142.4 lb

## 2016-07-12 DIAGNOSIS — I341 Nonrheumatic mitral (valve) prolapse: Secondary | ICD-10-CM

## 2016-07-12 DIAGNOSIS — I340 Nonrheumatic mitral (valve) insufficiency: Secondary | ICD-10-CM

## 2016-07-12 DIAGNOSIS — I471 Supraventricular tachycardia: Secondary | ICD-10-CM | POA: Diagnosis not present

## 2016-07-12 DIAGNOSIS — Z79899 Other long term (current) drug therapy: Secondary | ICD-10-CM

## 2016-07-12 DIAGNOSIS — R002 Palpitations: Secondary | ICD-10-CM

## 2016-07-12 MED ORDER — TOPROL XL 25 MG PO TB24: 37.5000 mg | ORAL_TABLET | Freq: Every day | ORAL | 3 refills | Status: DC

## 2016-07-12 NOTE — Progress Notes (Signed)
Patient ID: [name], female   DOB: [birth year], 71 y.o.   MRN: [id number]      Primary M.D.: Dr. Carol Ada  HPI: [name] is a 71 y.o. female who presents for a one year follow-up cardiology evaluation.  Ms. Bigbee has a history of SVT and has been treated with beta blocker therapy with Toprol-XL 37.5 mg daily.  An echo Doppler study in 2010 showed normal systolic function.  She had mild mitral regurgitation.  She has remained fairly active.  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    When I her in February 2016 and at that time she was complaining of difficulty with waking up at night. She sleeps on her back and cannot sleep on her side since she notices palpitations.  She does snore. Remotely she had a sleep study. She also has noticed some development of left-sided chest discomfort.  She denies significant shortness of breath.  She denies presyncope or syncope.  She was very concerned about these palpitations and some episodes of chest discomfort.  She  underwent an echo Doppler study on 05/27/2014 which showed an ejection fraction at 60-65%.  There was grade 1 diastolic dysfunction.  She had documented late systolic bileaflet mitral valve prolapse with trivial mitral regurgitation.  There was mild tricuspid regurgitation.  Estimated PA pressures were normal at 22 mm.  She underwent a nuclear perfusion study which was normal.  She had excellent exercise capacity and exercise for 10 minutes in the Bruce protocol without chest pain or ECG changes.  Scintigraphic images were normal.   I recommended that he change the way she was taking her metoprolol succinate and recommended that she take 25 mg in the morning and 12.5 mg at night.  She feels that this has dramatically improved her symptomatic palpitations nocturnally.  She denies daytime fatigue.    Since I last saw her one year ago she has continued to feel well. She specifically denies any chest pain or palpitations.   She has noticed resolution of palpitations at night by taking the 12.5 mg evening dose.  She will be seeing her primary physician several months who be checking complete set of laboratory.  She has remained active.  She presents for one-year evaluation.  Past Medical History:  Diagnosis Date  . Atypical angina ([place]) 08/17/2005   Exercise stress test-EF74% ,normal perfusion  . Dysrhythmia 02/09/2009   history of SVT ; Echo normal systolic and diastolic function ,IZ=>12%  . Mitral valve prolapse    history mild MVP    No past surgical history on file.  No Known Allergies  Current Outpatient Prescriptions  Medication Sig Dispense Refill  . aspirin 81 MG tablet Take 81 mg by mouth 2 (two) times a week.     . Calcium-Vitamin D-Vitamin K (VIACTIV PO) Take by mouth.    . cholecalciferol (VITAMIN D) 1000 UNITS tablet Take 1,000 Units by mouth daily.    . TOPROL XL 25 MG 24 hr tablet Take 1.5 tablets (37.5 mg total) by mouth daily. 135 tablet 3   No current facility-administered medications for this visit.     Social History   Social History  . Marital status: Married    Spouse name: N/A  . Number of children: 2  . Years of education: N/A   Occupational History  . Not on file.   Social History Main Topics  . Smoking status: Never Smoker  . Smokeless tobacco: Never Used  . Alcohol  use Yes     Comment: occas glass of wine  . Drug use: Unknown  . Sexual activity: Not on file   Other Topics Concern  . Not on file   Social History Narrative  . 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  She is married, has 2 children.  She does exercise fairly regularly.  She drinks occasional alcohol.  No tobacco use.  Family History  Problem Relation Age of Onset  . Heart disease Father     ROS General: Negative; No fevers, chills, or night sweats;  HEENT:  Positive with the complaint that her "nose runs all the time"  No changes in vision or  hearing, difficulty swallowing Pulmonary: Negative; No cough, wheezing, shortness of breath, hemoptysis Cardiovascular:   See history of present illness GI: Negative; No nausea, vomiting, diarrhea, or abdominal pain GU: Negative; No dysuria, hematuria, or difficulty voiding Musculoskeletal: Negative; no myalgias, joint pain, or weakness Hematologic/Oncology: Negative; no easy bruising, bleeding Endocrine: Negative; no heat/cold intolerance; no diabetes Neuro: Negative; no changes in balance, headaches Skin: Negative; No rashes or skin lesions Psychiatric: Negative; No behavioral problems, depression Sleep: Negative; No snoring, daytime sleepiness, hypersomnolence, bruxism, restless legs, hypnogognic hallucinations, no cataplexy Other comprehensive 14 point system review is negative.   PE:  BP 102/68   Pulse 68   Ht 5' 6"  (1.676 m)   Wt 142 lb 6.4 oz (64.6 kg)   BMI 22.98 kg/m    Repeat blood pressure by me 106/70  Wt Readings from Last 3 Encounters:  07/12/16 142 lb 6.4 oz (64.6 kg)  07/06/15 139 lb 8 oz (63.3 kg)  06/11/14 143 lb 9.6 oz (65.1 kg)   General: Alert, oriented, no distress.  Skin: normal turgor, no rashes HEENT: Normocephalic, atraumatic. Pupils round and reactive; sclera anicteric;no lid lag. Extraocular muscles intact;; no xanthelasmas. Nose  Significant asymmetry of her nares with her left nares being very thin and narrow compared to her right. Mouth/Parynx benign; Mallinpatti scale 2 Neck: No JVD, no carotid bruits; normal carotid upstroke Lungs: clear to ausculatation and percussion; no wheezing or rales Chest wall: no tenderness to palpitation Heart: RRR, s1 s2 normal; 2/6 systolic murmur at the apex wiith intermittent systolic clicks; no diastolic murmur, rub thrills or heaves Abdomen: soft, nontender; no hepatosplenomehaly, BS+; abdominal aorta nontender and not dilated by palpation. Back: no CVA tenderness Pulses 2+ Extremities: no clubbing cyanosis or  edema, Homan's sign negative  Neurologic: grossly nonfocal; cranial nerves grossly normal. Psychologic: normal affect and mood.  ECG (independently read by me): Normal sinus rhythm 68 bpm.  UTC.  Interval 423 ms.  PR interval 160 ms.  April 2017 ECG (independently read by me): Normal sinus rhythm at 65.  No ectopy.  Normal intervals.  QTC 436 ms.  05/05/2014 ECG (independently read by me):  Normal sinus rhythm at 75 bpm. No significant ST segment changes.  Prior ECG (independently read by me): Sinus rhythm at 59 beats per minute.  Nondiagnostic T changes V1, V2.  Normal intervals.  LABS:  BMP Latest Ref Rng & Units 05/10/2014 07/03/2013  Glucose 70 - 99 mg/dL 81 87  BUN 6 - 23 mg/dL 22 18  Creatinine 0.50 - 1.10 mg/dL 0.72 0.79  Sodium 135 - 145 mEq/L 138 141  Potassium 3.5 - 5.3 mEq/L 4.6 4.4  Chloride 96 - 112 mEq/L 102 100  CO2 19 - 32 mEq/L 28 29  Calcium 8.4 - 10.5 mg/dL  9.8 9.6    Hepatic Function Latest Ref Rng & Units 05/10/2014 07/03/2013  Total Protein 6.0 - 8.3 g/dL 6.7 6.7  Albumin 3.5 - 5.2 g/dL 4.2 4.2  AST 0 - 37 U/L 18 19  ALT 0 - 35 U/L 14 14  Alk Phosphatase 39 - 117 U/L 103 98  Total Bilirubin 0.2 - 1.2 mg/dL 2.2(H) 2.0(H)    CBC Latest Ref Rng & Units 05/10/2014 07/03/2013  WBC 4.0 - 10.5 K/uL 6.7 6.6  Hemoglobin 12.0 - 15.0 g/dL 15.2(H) 14.9  Hematocrit 36.0 - 46.0 % 46.9(H) 43.0  Platelets 150 - 400 K/uL 203 215   Lab Results  Component Value Date   MCV 95.5 05/10/2014   MCV 89.0 07/03/2013   Lab Results  Component Value Date   TSH 1.626 05/10/2014  No results found for: HGBA1C  Lipid Panel     Component Value Date/Time   CHOL 189 05/10/2014 0849   TRIG 57 05/10/2014 0849   HDL 75 05/10/2014 0849   CHOLHDL 2.5 05/10/2014 0849   VLDL 11 05/10/2014 0849   LDLCALC 103 (H) 05/10/2014 0849    IMPRESSION:  1. Palpitations   2. Medication management   3. Mitral valve prolapse   4. Non-rheumatic mitral regurgitation   5. SVT (supraventricular  tachycardia) ([facility])    ASSESSMENT AND PLAN: Ms. Harly Pipkins is a 71 year-old female with previously documented supraventricular tachycardia.  She has been on beta blocker therapy.   Her echo Doppler study confirms bileaflet late systolic mitral valve prolapse.  She has multiple systolic clicks on exam due to her mitral valve prolapse.  LV function is normal.  There is mild diastolic relaxation abnormality.  A nuclear perfusion study demonstrated good exercise tolerance with normal perfusion.  She's not had any palpitations on her current regimen of Toprol-XL for which she takes 25 mg in the morning and 12.5 mg at night.  Specifically, this has completely resolved her prior nocturnal palpitations.  She sees Dr. Carol Ada, for primary care.  I set her laboratory be sent to our office for my review.  She will continue her current medical regimen.  As long as she remains stable, I will see her in one year for follow-up neurologic evaluation.    Troy Sine, MD, [facility]  07/12/2016 2:00 PM

## 2016-07-12 NOTE — Patient Instructions (Addendum)
Your physician wants you to follow-up in: 1 year or sooner if needed. You will receive a reminder letter in the mail two months in advance. If you don't receive a letter, please call our office to schedule the follow-up appointment.   If you need a refill on your cardiac medications before your next appointment, please call your pharmacy.   

## 2016-08-24 DIAGNOSIS — Z1211 Encounter for screening for malignant neoplasm of colon: Secondary | ICD-10-CM | POA: Diagnosis not present

## 2016-09-11 DIAGNOSIS — R921 Mammographic calcification found on diagnostic imaging of breast: Secondary | ICD-10-CM | POA: Diagnosis not present

## 2016-09-12 ENCOUNTER — Other Ambulatory Visit: Payer: Self-pay | Admitting: Cardiovascular Disease

## 2016-09-26 ENCOUNTER — Other Ambulatory Visit: Payer: Self-pay

## 2016-09-26 DIAGNOSIS — I471 Supraventricular tachycardia: Secondary | ICD-10-CM | POA: Diagnosis not present

## 2016-09-26 DIAGNOSIS — Z0000 Encounter for general adult medical examination without abnormal findings: Secondary | ICD-10-CM | POA: Diagnosis not present

## 2016-09-26 DIAGNOSIS — E7800 Pure hypercholesterolemia, unspecified: Secondary | ICD-10-CM | POA: Diagnosis not present

## 2016-09-26 DIAGNOSIS — Z1389 Encounter for screening for other disorder: Secondary | ICD-10-CM | POA: Diagnosis not present

## 2016-09-26 MED ORDER — TOPROL XL 25 MG PO TB24: 37.5000 mg | ORAL_TABLET | Freq: Every day | ORAL | 3 refills | Status: DC

## 2016-12-04 DIAGNOSIS — Z23 Encounter for immunization: Secondary | ICD-10-CM | POA: Diagnosis not present

## 2017-04-08 DIAGNOSIS — H04123 Dry eye syndrome of bilateral lacrimal glands: Secondary | ICD-10-CM | POA: Diagnosis not present

## 2017-06-28 ENCOUNTER — Other Ambulatory Visit: Payer: Self-pay

## 2017-06-28 MED ORDER — TOPROL XL 25 MG PO TB24: 37.5000 mg | ORAL_TABLET | Freq: Every day | ORAL | 3 refills | Status: DC

## 2017-07-04 DIAGNOSIS — H16223 Keratoconjunctivitis sicca, not specified as Sjogren's, bilateral: Secondary | ICD-10-CM | POA: Diagnosis not present

## 2017-07-04 DIAGNOSIS — H2513 Age-related nuclear cataract, bilateral: Secondary | ICD-10-CM | POA: Diagnosis not present

## 2017-07-04 DIAGNOSIS — H25013 Cortical age-related cataract, bilateral: Secondary | ICD-10-CM | POA: Diagnosis not present

## 2017-07-04 DIAGNOSIS — D3132 Benign neoplasm of left choroid: Secondary | ICD-10-CM | POA: Diagnosis not present

## 2017-07-11 ENCOUNTER — Encounter: Payer: Self-pay | Admitting: Internal Medicine

## 2017-09-17 ENCOUNTER — Other Ambulatory Visit: Payer: Self-pay

## 2017-09-17 ENCOUNTER — Ambulatory Visit (AMBULATORY_SURGERY_CENTER): Payer: Self-pay | Admitting: *Deleted

## 2017-09-17 VITALS — Ht 66.0 in | Wt 140.4 lb

## 2017-09-17 DIAGNOSIS — Z86010 Personal history of colonic polyps: Secondary | ICD-10-CM

## 2017-09-17 NOTE — Progress Notes (Signed)
No egg or soy allergy known to patient  No issues with past sedation with any surgeries  or procedures, no intubation problems  No diet pills per patient No home 02 use per patient  No blood thinners per patient  Pt denies issues with constipation  No A fib or A flutter  EMMI video offered and declined by the patient. 

## 2017-09-23 ENCOUNTER — Encounter: Payer: Self-pay | Admitting: Cardiovascular Disease

## 2017-09-23 ENCOUNTER — Encounter

## 2017-09-23 ENCOUNTER — Ambulatory Visit (INDEPENDENT_AMBULATORY_CARE_PROVIDER_SITE_OTHER): Payer: Medicare Other | Admitting: Cardiovascular Disease

## 2017-09-23 VITALS — BP 94/66 | HR 69 | Ht 66.0 in | Wt 139.8 lb

## 2017-09-23 DIAGNOSIS — I5189 Other ill-defined heart diseases: Secondary | ICD-10-CM

## 2017-09-23 DIAGNOSIS — I471 Supraventricular tachycardia: Secondary | ICD-10-CM | POA: Diagnosis not present

## 2017-09-23 DIAGNOSIS — I519 Heart disease, unspecified: Secondary | ICD-10-CM | POA: Diagnosis not present

## 2017-09-23 DIAGNOSIS — I341 Nonrheumatic mitral (valve) prolapse: Secondary | ICD-10-CM

## 2017-09-23 DIAGNOSIS — R002 Palpitations: Secondary | ICD-10-CM | POA: Diagnosis not present

## 2017-09-23 NOTE — Patient Instructions (Signed)

## 2017-09-23 NOTE — Progress Notes (Signed)
Patient ID: [name], female   DOB: [birth year], 72 y.o.   MRN: [id number]      Primary M.D.: Dr. Carol Ada  HPI: [name] is a 72 y.o. female who presents for a 14 month follow-up cardiology evaluation.  [name] has a history of SVT and has been treated with beta blocker therapy with Toprol-XL 37.5 mg daily.  An echo Doppler study in 2010 showed normal systolic function.  She had mild mitral regurgitation.  She has remained fairly active.  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    When I her in February 2016 and at that time she was complaining of difficulty with waking up at night. She sleeps on her back and cannot sleep on her side since she notices palpitations.  She does snore. Remotely she had a sleep study. She also has noticed some development of left-sided chest discomfort.  She denies significant shortness of breath.  She denies presyncope or syncope.  She was very concerned about these palpitations and some episodes of chest discomfort.  She  underwent an echo Doppler study on 05/27/2014 which showed an ejection fraction at 60-65%.  There was grade 1 diastolic dysfunction.  She had documented late systolic bileaflet mitral valve prolapse with trivial mitral regurgitation.  There was mild tricuspid regurgitation.  Estimated PA pressures were normal at 22 mm.  She underwent a nuclear perfusion study which was normal.  She had excellent exercise capacity and exercise for 10 minutes in the Bruce protocol without chest pain or ECG changes.  Scintigraphic images were normal.   I recommended that he change the way she was taking her metoprolol succinate and recommended that she take 25 mg in the morning and 12.5 mg at night.  She feels that this has dramatically improved her symptomatic palpitations nocturnally.  She denies daytime fatigue.    I last saw her in May 2018 at which time she was doing well and denied any chest pain or palpitations.  Her palpitations  which were occurring nocturnally significantly improved with the addition of a 12.5 mg Toprol dose at night in addition to her 25 mg in the morning.   Since her last evaluation, she continues to feel well.  There was only one instance over the past 8 months where there was a very short-lived palpitations which resolved spontaneously.  She denies any chest pain or change in exercise tolerance.  She denies any dyspnea.  She will be undergoing a colonoscopy later this month with Dr. Carlean Purl.   Past Medical History:  Diagnosis Date  . Atypical angina ([place]) 08/17/2005   Exercise stress test-EF74% ,normal perfusion  . Dysrhythmia 02/09/2009   history of SVT ; Echo normal systolic and diastolic function ,NW=>29%  . Mitral valve prolapse    history mild MVP    Past Surgical History:  Procedure Laterality Date  . BREAST BIOPSY    . COLONOSCOPY    . POLYPECTOMY    . TONSILLECTOMY      No Known Allergies  Current Outpatient Medications  Medication Sig Dispense Refill  . aspirin 81 MG tablet Take 81 mg by mouth once a week.     . Calcium-Vitamin D-Vitamin K (VIACTIV PO) Take by mouth.    . cholecalciferol (VITAMIN D) 1000 UNITS tablet Take 1,000 Units by mouth daily.    . Multiple Vitamin (MULTI-VITAMIN PO) Take by mouth.    . TOPROL XL 25 MG 24 hr tablet Take 1.5 tablets (37.5 mg  total) by mouth daily. 135 tablet 3   No current facility-administered medications for this visit.     Social History   Socioeconomic History  . Marital status: Married    Spouse name: Not on file  . Number of children: 2  . Years of education: Not on file  . Highest education level: Not on file  Occupational History  . Not on file  Social Needs  . Financial resource strain: Not on file  . Food insecurity:    Worry: Not on file    Inability: Not on file  . Transportation needs:    Medical: Not on file    Non-medical: Not on file  Tobacco Use  . Smoking status: Never Smoker  . Smokeless tobacco:  Never Used  Substance and Sexual Activity  . Alcohol use: Yes    Comment: occas glass of wine  . Drug use: Never  . Sexual activity: Not on file  Lifestyle  . Physical activity:    Days per week: Not on file    Minutes per session: Not on file  . Stress: Not on file  Relationships  . Social connections:    Talks on phone: Not on file    Gets together: Not on file    Attends religious service: Not on file    Active member of club or organization: Not on file    Attends meetings of clubs or organizations: Not on file    Relationship status: Not on file  . Intimate partner violence:    Fear of current or ex partner: Not on file    Emotionally abused: Not on file    Physically abused: Not on file    Forced sexual activity: Not on file  Other Topics Concern  . Not on file  Social History Narrative  . 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  She is married, has 2 children.  She does exercise fairly regularly.  She drinks occasional alcohol.  No tobacco use.  Family History  Problem Relation Age of Onset  . Heart disease Father   . Colon cancer Neg Hx   . Esophageal cancer Neg Hx   . Pancreatic cancer Neg Hx   . Rectal cancer Neg Hx   . Stomach cancer Neg Hx     ROS General: Negative; No fevers, chills, or night sweats;  HEENT:  Positive with the complaint that her "nose runs all the time"  No changes in vision or hearing, difficulty swallowing Pulmonary: Negative; No cough, wheezing, shortness of breath, hemoptysis Cardiovascular:   See history of present illness GI: Negative; No nausea, vomiting, diarrhea, or abdominal pain GU: Negative; No dysuria, hematuria, or difficulty voiding Musculoskeletal: Negative; no myalgias, joint pain, or weakness Hematologic/Oncology: Negative; no easy bruising, bleeding Endocrine: Negative; no heat/cold intolerance; no diabetes Neuro: Negative; no changes in balance, headaches Skin: Negative; No rashes  or skin lesions Psychiatric: Negative; No behavioral problems, depression Sleep: Negative; No snoring, daytime sleepiness, hypersomnolence, bruxism, restless legs, hypnogognic hallucinations, no cataplexy Other comprehensive 14 point system review is negative.   PE:  BP 94/66   Pulse 69   Ht 5' 6"  (1.676 m)   Wt 139 lb 12.8 oz (63.4 kg)   BMI 22.56 kg/m    Repeat blood pressure by me was 104/66 supine and 100/64 standing.  Wt Readings from Last 3 Encounters:  09/23/17 139 lb 12.8 oz (63.4 kg)  09/17/17 140 lb 6.4 oz (63.7 kg)  07/12/16 142 lb 6.4 oz (64.6 kg)   General: Alert, oriented, no distress.  Skin: normal turgor, no rashes, warm and dry HEENT: Normocephalic, atraumatic. Pupils equal round and reactive to light; sclera anicteric; extraocular muscles intact;  Nose with asymmetry of her nares with the left nares being very thin relative to her right. Mouth/Parynx benign; Mallinpatti scale 2 Neck: No JVD, no carotid bruits; normal carotid upstroke Lungs: clear to ausculatation and percussion; no wheezing or rales Chest wall: without tenderness to palpitation Heart: PMI not displaced, RRR, s1 s2 normal, 2/6 systolic murmur at the apex with an intermittent systolic click., no diastolic murmur, no rubs, gallops, thrills, or heaves Abdomen: soft, nontender; no hepatosplenomehaly, BS+; abdominal aorta nontender and not dilated by palpation. Back: no CVA tenderness Pulses 2+ Musculoskeletal: full range of motion, normal strength, no joint deformities Extremities: no clubbing cyanosis or edema, Homan's sign negative  Neurologic: grossly nonfocal; Cranial nerves grossly wnl Psychologic: Normal mood and affect    ECG (independently read by me): Normal sinus rhythm with one isolated PVC.  Heart rate 69 bpm.  Normal intervals.  May 2018 ECG (independently read by me): Normal sinus rhythm 68 bpm.  UTC.  Interval 423 ms.  PR interval 160 ms.  April 2017 ECG (independently read by  me): Normal sinus rhythm at 65.  No ectopy.  Normal intervals.  QTC 436 ms.  05/05/2014 ECG (independently read by me):  Normal sinus rhythm at 75 bpm. No significant ST segment changes.  Prior ECG (independently read by me): Sinus rhythm at 59 beats per minute.  Nondiagnostic T changes V1, V2.  Normal intervals.  LABS:  BMP Latest Ref Rng & Units 05/10/2014 07/03/2013  Glucose 70 - 99 mg/dL 81 87  BUN 6 - 23 mg/dL 22 18  Creatinine 0.50 - 1.10 mg/dL 0.72 0.79  Sodium 135 - 145 mEq/L 138 141  Potassium 3.5 - 5.3 mEq/L 4.6 4.4  Chloride 96 - 112 mEq/L 102 100  CO2 19 - 32 mEq/L 28 29  Calcium 8.4 - 10.5 mg/dL 9.8 9.6    Hepatic Function Latest Ref Rng & Units 05/10/2014 07/03/2013  Total Protein 6.0 - 8.3 g/dL 6.7 6.7  Albumin 3.5 - 5.2 g/dL 4.2 4.2  AST 0 - 37 U/L 18 19  ALT 0 - 35 U/L 14 14  Alk Phosphatase 39 - 117 U/L 103 98  Total Bilirubin 0.2 - 1.2 mg/dL 2.2(H) 2.0(H)    CBC Latest Ref Rng & Units 05/10/2014 07/03/2013  WBC 4.0 - 10.5 K/uL 6.7 6.6  Hemoglobin 12.0 - 15.0 g/dL 15.2(H) 14.9  Hematocrit 36.0 - 46.0 % 46.9(H) 43.0  Platelets 150 - 400 K/uL 203 215   Lab Results  Component Value Date   MCV 95.5 05/10/2014   MCV 89.0 07/03/2013   Lab Results  Component Value Date   TSH 1.626 05/10/2014  No results found for: HGBA1C  Lipid Panel     Component Value Date/Time   CHOL 189 05/10/2014 0849   TRIG 57 05/10/2014 0849   HDL 75 05/10/2014 0849   CHOLHDL 2.5 05/10/2014 0849   VLDL 11 05/10/2014 0849   LDLCALC 103 (H) 05/10/2014 0849    IMPRESSION:  1. Palpitations   2. Mitral valve prolapse   3. H/O SVT (supraventricular tachycardia) ([place])   4. Grade I diastolic dysfunction      ASSESSMENT AND PLAN: [name] is a 72 year-old female with previously documented supraventricular tachycardia which has been treated successfully with beta-blocker  therapy.  Her echo Doppler study has confirmed bileaflet late systolic mitral valve prolapse.  She has  multiple systolic clicks on exam due to this.  She has documented normal systolic function with mild diastolic relaxation abnormality.  A prior nuclear perfusion study has demonstrated good exercise tolerance with normal perfusion.  She is doing well on her regimen of Toprol XL 25 mg in the morning and 12.5 mg in the evening.  By taking the evening dose her previous nocturnal palpitations have almost completely resolved.  She will be undergoing a 10-year follow-up colonoscopy and had concerns about potential need for oxygenation.  She does have nasal aperture asymmetry and I assured her that her oxygenation will be monitored during her sedation supplemental oxygen can be administered if there was significant drop in oxygenation.  She continues to be active and walks daily and exercises regularly.  Her blood pressure is stable without orthostatic change.  I will see her in 1 year for reevaluation.  Troy Sine, MD, [facility]  09/23/2017 6:34 PM

## 2017-10-03 ENCOUNTER — Ambulatory Visit (AMBULATORY_SURGERY_CENTER): Payer: Medicare Other | Admitting: Internal Medicine

## 2017-10-03 ENCOUNTER — Encounter: Payer: Self-pay | Admitting: Internal Medicine

## 2017-10-03 VITALS — BP 110/71 | HR 67 | Temp 97.8°F | Resp 19 | Ht 66.0 in | Wt 140.0 lb

## 2017-10-03 DIAGNOSIS — D120 Benign neoplasm of cecum: Secondary | ICD-10-CM

## 2017-10-03 DIAGNOSIS — Z86010 Personal history of colonic polyps: Secondary | ICD-10-CM | POA: Diagnosis not present

## 2017-10-03 DIAGNOSIS — K635 Polyp of colon: Secondary | ICD-10-CM | POA: Diagnosis not present

## 2017-10-03 DIAGNOSIS — I10 Essential (primary) hypertension: Secondary | ICD-10-CM | POA: Diagnosis not present

## 2017-10-03 MED ORDER — SODIUM CHLORIDE 0.9 % IV SOLN: 500.0000 mL | Freq: Once | INTRAVENOUS | Status: DC

## 2017-10-03 NOTE — Patient Instructions (Addendum)
   I found and removed 3 tiny colon polyps. I will let you know pathology results and if/when to have another routine colonoscopy by mail and/or My Chart.  I appreciate the opportunity to care for you. Gatha Mayer, MD, [facility]  HANDOUT GIVEN FOR POLYPS.  YOU HAD AN ENDOSCOPIC PROCEDURE TODAY AT [facility]:   Refer to the procedure report that was given to you for any specific questions about what was found during the examination.  If the procedure report does not answer your questions, please call your gastroenterologist to clarify.  If you requested that your care partner not be given the details of your procedure findings, then the procedure report has been included in a sealed envelope for you to review at your convenience later.  YOU SHOULD EXPECT: Some feelings of bloating in the abdomen. Passage of more gas than usual.  Walking can help get rid of the air that was put into your GI tract during the procedure and reduce the bloating. If you had a lower endoscopy (such as a colonoscopy or flexible sigmoidoscopy) you may notice spotting of blood in your stool or on the toilet paper. If you underwent a bowel prep for your procedure, you may not have a normal bowel movement for a few days.  Please Note:  You might notice some irritation and congestion in your nose or some drainage.  This is from the oxygen used during your procedure.  There is no need for concern and it should clear up in a day or so.  SYMPTOMS TO REPORT IMMEDIATELY:   Following lower endoscopy (colonoscopy or flexible sigmoidoscopy):  Excessive amounts of blood in the stool  Significant tenderness or worsening of abdominal pains  Swelling of the abdomen that is new, acute  Fever of 100F or higher  For urgent or emergent issues, a gastroenterologist can be reached at any hour by calling [phone].   DIET:  We do recommend a small meal at first, but then you may proceed to your regular diet.   Drink plenty of fluids but you should avoid alcoholic beverages for 24 hours.  ACTIVITY:  You should plan to take it easy for the rest of today and you should NOT DRIVE or use heavy machinery until tomorrow (because of the sedation medicines used during the test).    FOLLOW UP: Our staff will call the number listed on your records the next business day following your procedure to check on you and address any questions or concerns that you may have regarding the information given to you following your procedure. If we do not reach you, we will leave a message.  However, if you are feeling well and you are not experiencing any problems, there is no need to return our call.  We will assume that you have returned to your regular daily activities without incident.  If any biopsies were taken you will be contacted by phone or by letter within the next 1-3 weeks.  Please call us at [phone] if you have not heard about the biopsies in 3 weeks.    SIGNATURES/CONFIDENTIALITY: You and/or your care partner have signed paperwork which will be entered into your electronic medical record.  These signatures attest to the fact that that the information above on your After Visit Summary has been reviewed and is understood.  Full responsibility of the confidentiality of this discharge information lies with you and/or your care-partner.

## 2017-10-03 NOTE — Op Note (Signed)
[facility] Patient Name: [name] Procedure Date: 10/03/2017 8:32 AM MRN: [id number] Endoscopist: Gatha Mayer , MD Age: 72 Referring MD:  Date of Birth: 1945/07/16 Gender: Female Account #: [id number] Procedure:                Colonoscopy Indications:              Surveillance: Personal history of adenomatous                            polyps on last colonoscopy > 5 years ago Medicines:                Propofol per Anesthesia, Monitored Anesthesia Care Procedure:                Pre-Anesthesia Assessment:                           - Prior to the procedure, a History and Physical                            was performed, and patient medications and                            allergies were reviewed. The patient's tolerance of                            previous anesthesia was also reviewed. The risks                            and benefits of the procedure and the sedation                            options and risks were discussed with the patient.                            All questions were answered, and informed consent                            was obtained. Prior Anticoagulants: The patient has                            taken no previous anticoagulant or antiplatelet                            agents. ASA Grade Assessment: II - A patient with                            mild systemic disease. After reviewing the risks                            and benefits, the patient was deemed in                            satisfactory condition to undergo the procedure.  After obtaining informed consent, the colonoscope                            was passed under direct vision. Throughout the                            procedure, the patient's blood pressure, pulse, and                            oxygen saturations were monitored continuously. The                            Colonoscope was introduced through the anus and   advanced to the the cecum, identified by                            appendiceal orifice and ileocecal valve. The                            colonoscopy was performed without difficulty. The                            patient tolerated the procedure well. The quality                            of the bowel preparation was good. The ileocecal                            valve, appendiceal orifice, and rectum were                            photographed. The bowel preparation used was                            Miralax. Scope In: 8:44:19 AM Scope Out: 9:08:32 AM Scope Withdrawal Time: 0 hours 18 minutes 6 seconds  Total Procedure Duration: 0 hours 24 minutes 13 seconds  Findings:                 The perianal and digital rectal examinations were                            normal.                           Three sessile polyps were found in the cecum. The                            polyps were diminutive in size. These polyps were                            removed with a cold snare. Resection and retrieval                            were complete. Verification of patient  identification for the specimen was done. Estimated                            blood loss was minimal.                           The exam was otherwise without abnormality on                            direct and retroflexion views. Complications:            No immediate complications. Estimated Blood Loss:     Estimated blood loss was minimal. Impression:               - Three diminutive polyps in the cecum, removed                            with a cold snare. Resected and retrieved.                           - The examination was otherwise normal on direct                            and retroflexion views.                           - Personal history of colonic polyp - adenoma 2011. Recommendation:           - Patient has a contact number available for                            emergencies. The  signs and symptoms of potential                            delayed complications were discussed with the                            patient. Return to normal activities tomorrow.                            Written discharge instructions were provided to the                            patient.                           - Resume previous diet.                           - Continue present medications.                           - Repeat colonoscopy is recommended. The                            colonoscopy date will be determined after pathology  results from today's exam become available for                            review. Gatha Mayer, MD 10/03/2017 9:14:24 AM This report has been signed electronically.

## 2017-10-03 NOTE — Progress Notes (Signed)
Report to PACU, RN, vss, BBS= Clear.  

## 2017-10-03 NOTE — Progress Notes (Signed)
Called to room to assist during endoscopic procedure.  Patient ID and intended procedure confirmed with present staff. Received instructions for my participation in the procedure from the performing physician.  

## 2017-10-03 NOTE — Progress Notes (Signed)
Pt's states no medical or surgical changes since previsit or office visit. 

## 2017-10-08 ENCOUNTER — Telehealth: Payer: Self-pay

## 2017-10-08 NOTE — Telephone Encounter (Signed)
  Follow up Call-  Call back number 10/03/2017  Post procedure Call Back phone  # [phone]  Permission to leave phone message Yes  Some recent data might be hidden     Patient questions:  Do you have a fever, pain , or abdominal swelling? No. Pain Score  0 *  Have you tolerated food without any problems? Yes.    Have you been able to return to your normal activities? Yes.    Do you have any questions about your discharge instructions: Diet   No. Medications  No. Follow up visit  No.  Do you have questions or concerns about your Care? No.  Actions: * If pain score is 4 or above: No action needed, pain <4.

## 2017-10-09 ENCOUNTER — Encounter: Payer: Self-pay | Admitting: Internal Medicine

## 2017-10-09 DIAGNOSIS — Z860100 Personal history of colon polyps, unspecified: Secondary | ICD-10-CM | POA: Insufficient documentation

## 2017-10-09 HISTORY — DX: Personal history of colon polyps, unspecified: Z86.0100

## 2017-10-09 HISTORY — DX: Personal history of colonic polyps: Z86.010

## 2017-10-09 NOTE — Progress Notes (Signed)
2 adenomas and ssp Recall 3 years and consider repeat My Chart letter

## 2017-10-10 DIAGNOSIS — I471 Supraventricular tachycardia: Secondary | ICD-10-CM | POA: Diagnosis not present

## 2017-10-10 DIAGNOSIS — E7800 Pure hypercholesterolemia, unspecified: Secondary | ICD-10-CM | POA: Diagnosis not present

## 2017-10-10 DIAGNOSIS — Z1389 Encounter for screening for other disorder: Secondary | ICD-10-CM | POA: Diagnosis not present

## 2017-10-10 DIAGNOSIS — M818 Other osteoporosis without current pathological fracture: Secondary | ICD-10-CM | POA: Diagnosis not present

## 2017-10-10 DIAGNOSIS — C801 Malignant (primary) neoplasm, unspecified: Secondary | ICD-10-CM

## 2017-10-10 DIAGNOSIS — Z0000 Encounter for general adult medical examination without abnormal findings: Secondary | ICD-10-CM | POA: Diagnosis not present

## 2017-10-10 HISTORY — DX: Malignant (primary) neoplasm, unspecified: C80.1

## 2017-10-15 DIAGNOSIS — Z1231 Encounter for screening mammogram for malignant neoplasm of breast: Secondary | ICD-10-CM | POA: Diagnosis not present

## 2017-10-18 DIAGNOSIS — R921 Mammographic calcification found on diagnostic imaging of breast: Secondary | ICD-10-CM | POA: Diagnosis not present

## 2017-10-24 ENCOUNTER — Other Ambulatory Visit: Payer: Self-pay | Admitting: Radiology

## 2017-10-24 DIAGNOSIS — R921 Mammographic calcification found on diagnostic imaging of breast: Secondary | ICD-10-CM | POA: Diagnosis not present

## 2017-10-24 DIAGNOSIS — Z01818 Encounter for other preprocedural examination: Secondary | ICD-10-CM | POA: Diagnosis not present

## 2017-10-24 DIAGNOSIS — C50312 Malignant neoplasm of lower-inner quadrant of left female breast: Secondary | ICD-10-CM | POA: Diagnosis not present

## 2017-10-24 DIAGNOSIS — D0512 Intraductal carcinoma in situ of left breast: Secondary | ICD-10-CM | POA: Diagnosis not present

## 2017-10-28 ENCOUNTER — Other Ambulatory Visit: Payer: Self-pay | Admitting: Dermatology

## 2017-10-28 DIAGNOSIS — L821 Other seborrheic keratosis: Secondary | ICD-10-CM | POA: Diagnosis not present

## 2017-10-28 DIAGNOSIS — D485 Neoplasm of uncertain behavior of skin: Secondary | ICD-10-CM | POA: Diagnosis not present

## 2017-10-28 DIAGNOSIS — L820 Inflamed seborrheic keratosis: Secondary | ICD-10-CM | POA: Diagnosis not present

## 2017-10-28 DIAGNOSIS — D229 Melanocytic nevi, unspecified: Secondary | ICD-10-CM | POA: Diagnosis not present

## 2017-10-29 ENCOUNTER — Telehealth: Payer: Self-pay | Admitting: Oncology

## 2017-10-29 ENCOUNTER — Encounter: Payer: Self-pay | Admitting: *Deleted

## 2017-10-29 DIAGNOSIS — C50812 Malignant neoplasm of overlapping sites of left female breast: Secondary | ICD-10-CM | POA: Insufficient documentation

## 2017-10-29 DIAGNOSIS — Z170 Estrogen receptor positive status [ER+]: Secondary | ICD-10-CM | POA: Insufficient documentation

## 2017-10-29 DIAGNOSIS — D0512 Intraductal carcinoma in situ of left breast: Secondary | ICD-10-CM

## 2017-10-29 NOTE — Telephone Encounter (Signed)
Spoke with patient to confirm afternoon [facility] appointment for 8/28, [facility] patient but appointment reminder letter was sent

## 2017-11-01 ENCOUNTER — Telehealth: Payer: Self-pay | Admitting: *Deleted

## 2017-11-01 NOTE — Telephone Encounter (Signed)
Spoke to pt concerning [facility] for 11/06/17. Discussed appt and what to expect as well as flow of clinic.

## 2017-11-05 NOTE — Progress Notes (Signed)
[place]  [phone] [phone]     ID: [name] DOB: 15-Nov-1945  MR#: [id number]  OBS#:[id number]  Patient Care Team: Carol Ada, MD as PCP - General (Family Medicine) Jovita Kussmaul, MD as Consulting Physician (General Surgery) Magrinat, Virgie Dad, MD as Consulting Physician (Oncology) Gery Pray, MD as Consulting Physician (Radiation Oncology) Monna Fam, MD as Consulting Physician (Ophthalmology) Troy Sine, MD as Consulting Physician (Cardiology) Lavonna Monarch, MD as Consulting Physician (Dermatology) Gatha Mayer, MD as Consulting Physician (Gastroenterology) OTHER MD:  CHIEF COMPLAINT: Estrogen receptor positive ductal carcinoma in situ  CURRENT TREATMENT: Awaiting definitive surgery   HISTORY OF CURRENT ILLNESS: [name] had routine screening mammography on 10/15/2017 showing a possible abnormality in the left breast. She underwent unilateral left diagnostic mammography with tomography at [facility] on 10/18/2017 showing: breast density category C. There is a new 3.0 cm group of grouped calcifications in the left breast that are suspicious for malignancy.   Accordingly on 10/24/2017 she proceeded to biopsy of the left breast area in question. The pathology from this procedure showed ([id number]): Ductal carcinoma in situ, intermediate to high grade. Prognostic indicators significant for: estrogen receptor, 95% positive and progesterone receptor, 95% positive, both with strong staining intensity.  The patient's subsequent history is as detailed below.  INTERVAL HISTORY: [name] was evaluated in the multidisciplinary breast cancer clinic on 11/05/2017 accompanied by her husband, [name]. Her case was also presented at the multidisciplinary breast cancer conference on the same day. At that time a preliminary plan was proposed: Consider MRI given the extent of calcifications; surgery, then adjuvant radiation, then  antiestrogens.  No sentinel lymph node sampling   REVIEW OF SYSTEMS: There were no specific symptoms leading to the original mammogram, which was routinely scheduled. The patient denies unusual headaches, visual changes, nausea, vomiting, stiff neck, dizziness, or gait imbalance. There has been no cough, phlegm production, or pleurisy, no chest pain or pressure, and no change in bowel or bladder habits. The patient denies fever, rash, bleeding, unexplained fatigue or unexplained weight loss. A detailed review of systems was otherwise entirely negative.   PAST MEDICAL HISTORY: Past Medical History:  Diagnosis Date  . Atypical angina ([place]) 08/17/2005   Exercise stress test-EF74% ,normal perfusion  . Dysrhythmia 02/09/2009   history of SVT ; Echo normal systolic and diastolic function ,TW=>65%  . Hx of colonic polyps adenomas and ssp 10/09/2017  . Mitral valve prolapse    history mild MVP  Heart rate is well controlled.   PAST SURGICAL HISTORY: Past Surgical History:  Procedure Laterality Date  . BREAST BIOPSY    . COLONOSCOPY    . POLYPECTOMY    . TONSILLECTOMY      FAMILY HISTORY Family History  Problem Relation Age of Onset  . Heart disease Father   . Colon cancer Neg Hx   . Esophageal cancer Neg Hx   . Pancreatic cancer Neg Hx   . Rectal cancer Neg Hx   . Stomach cancer Neg Hx   The patient's father died at 28 due to congestive heart failure. The patient's mother died at age 81 due to "old age ". She also had a tumor of the tear duct diagnosed at age 43. The patient had no brothers, 1 sister. She denies a history of breast or ovarian cancer in the family.  Note that the patient is of [place] but not Ashkenazi Jewish ethnicity  GYNECOLOGIC HISTORY:  No LMP recorded. Patient is postmenopausal.  Menarche: 72 years old Age at first live birth: 72 years old She is GX P2.  Her LMP was at age 69.  She used oral contraception from [id number] with no complications.  She never used  HRT.    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 Her husband, [name], is also retired from being a English as a second language teacher. The patient has 2 daughters: [name] who lives in [place] and works as a Scientist, forensic, and [name] who lives in [place], [place]. The patient has no grandchildren.    ADVANCED DIRECTIVES: The patient's husband is her 89.    HEALTH MAINTENANCE: Social History   Tobacco Use  . Smoking status: Never Smoker  . Smokeless tobacco: Never Used  Substance Use Topics  . Alcohol use: Yes    [name]: occas glass of wine  . Drug use: Never     Colonoscopy: 10/03/2017/ Dr. Carlean Purl found 2 adenomas and sessile polyp; repeat 3 years planned  PAP:  Bone density: 08/16/2014 at [facility] showed a T score of -3.0   No Known Allergies  Current Outpatient Medications  Medication Sig Dispense Refill  . aspirin 81 MG tablet Take 81 mg by mouth once a week.     . Calcium-Vitamin D-Vitamin K (VIACTIV PO) Take by mouth.    . cholecalciferol (VITAMIN D) 1000 UNITS tablet Take 1,000 Units by mouth daily.    . Multiple Vitamin (MULTI-VITAMIN PO) Take by mouth.    . TOPROL XL 25 MG 24 hr tablet Take 1.5 tablets (37.5 mg total) by mouth daily. 135 tablet 3   No current facility-administered medications for this visit.     OBJECTIVE: Middle-aged white woman who appears well  Vitals:   11/06/17 1312  BP: 107/77  Pulse: 69  Resp: 16  Temp: (!) 97.5 F (36.4 C)  SpO2: 98%     Body mass index is 22.39 kg/m.   Wt Readings from Last 3 Encounters:  11/06/17 138 lb 11.2 oz (62.9 kg)  10/03/17 140 lb (63.5 kg)  09/23/17 139 lb 12.8 oz (63.4 kg)      ECOG FS:0 - Asymptomatic  Ocular: Sclerae unicteric, pupils round and equal Ear-nose-throat: Oropharynx clear and moist Lymphatic: No cervical or supraclavicular adenopathy Lungs no rales or rhonchi Heart regular rate and rhythm Abd soft, nontender, positive bowel  sounds MSK no focal spinal tenderness, no joint edema Neuro: non-focal, well-oriented, appropriate affect Breasts: The right breast is unremarkable.  The left breast is status post recent biopsy.  There is a mild induration at the ecchymosis site from this procedure.  Both axillae are benign.   LAB RESULTS:  CMP     Component Value Date/Time   NA 140 11/06/2017 1222   K 4.7 11/06/2017 1222   CL 103 11/06/2017 1222   CO2 29 11/06/2017 1222   GLUCOSE 110 (H) 11/06/2017 1222   BUN 21 11/06/2017 1222   CREATININE 0.91 11/06/2017 1222   CREATININE 0.72 05/10/2014 0849   CALCIUM 9.9 11/06/2017 1222   PROT 7.0 11/06/2017 1222   ALBUMIN 4.0 11/06/2017 1222   AST 19 11/06/2017 1222   ALT 14 11/06/2017 1222   ALKPHOS 110 11/06/2017 1222   BILITOT 2.0 (H) 11/06/2017 1222   GFRNONAA >60 11/06/2017 1222   GFRAA >60 11/06/2017 1222    No results found for: TOTALPROTELP, ALBUMINELP, A1GS, A2GS, BETS, BETA2SER, GAMS, MSPIKE, SPEI  No results found for: KPAFRELGTCHN, LAMBDASER, KAPLAMBRATIO  Lab Results  Component Value  Date   WBC 8.1 11/06/2017   NEUTROABS 5.0 11/06/2017   HGB 14.8 11/06/2017   HCT 44.9 11/06/2017   MCV 95.7 11/06/2017   PLT 165 11/06/2017    _0 @  No results found for: LABCA2  No components found for: SPQZRA076  No results for input(s): INR in the last 168 hours.  No results found for: LABCA2  No results found for: AUQ333  No results found for: LKT625  No results found for: WLS937  No results found for: CA2729  No components found for: HGQUANT  No results found for: CEA1 / No results found for: CEA1   No results found for: AFPTUMOR  No results found for: [place]  No results found for: PSA1  Appointment on 11/06/2017  Component Date Value Ref Range Status  . WBC Count 11/06/2017 8.1  3.9 - 10.3 K/uL Final  . RBC 11/06/2017 4.69  3.70 - 5.45 MIL/uL Final  . Hemoglobin 11/06/2017 14.8  11.6 - 15.9 g/dL Final  . HCT 11/06/2017  44.9  34.8 - 46.6 % Final  . MCV 11/06/2017 95.7  79.5 - 101.0 fL Final  . MCH 11/06/2017 31.5  25.1 - 34.0 pg Final  . MCHC 11/06/2017 32.9  31.5 - 36.0 g/dL Final  . RDW 11/06/2017 13.2  11.2 - 14.5 % Final  . Platelet Count 11/06/2017 165  145 - 400 K/uL Final  . Neutrophils Relative % 11/06/2017 62  % Final  . Neutro Abs 11/06/2017 5.0  1.5 - 6.5 K/uL Final  . Lymphocytes Relative 11/06/2017 29  % Final  . Lymphs Abs 11/06/2017 2.3  0.9 - 3.3 K/uL Final  . Monocytes Relative 11/06/2017 7  % Final  . Monocytes Absolute 11/06/2017 0.6  0.1 - 0.9 K/uL Final  . Eosinophils Relative 11/06/2017 1  % Final  . Eosinophils Absolute 11/06/2017 0.1  0.0 - 0.5 K/uL Final  . Basophils Relative 11/06/2017 1  % Final  . Basophils Absolute 11/06/2017 0.1  0.0 - 0.1 K/uL Final   Performed at [facility], [place] [address] [place], [place] [address]  . Sodium 11/06/2017 140  135 - 145 mmol/L Final  . Potassium 11/06/2017 4.7  3.5 - 5.1 mmol/L Final  . Chloride 11/06/2017 103  98 - 111 mmol/L Final  . CO2 11/06/2017 29  22 - 32 mmol/L Final  . Glucose, Bld 11/06/2017 110* 70 - 99 mg/dL Final  . BUN 11/06/2017 21  8 - 23 mg/dL Final  . Creatinine 11/06/2017 0.91  0.44 - 1.00 mg/dL Final  . Calcium 11/06/2017 9.9  8.9 - 10.3 mg/dL Final  . Total Protein 11/06/2017 7.0  6.5 - 8.1 g/dL Final  . Albumin 11/06/2017 4.0  3.5 - 5.0 g/dL Final  . AST 11/06/2017 19  15 - 41 U/L Final  . ALT 11/06/2017 14  0 - 44 U/L Final  . Alkaline Phosphatase 11/06/2017 110  38 - 126 U/L Final  . Total Bilirubin 11/06/2017 2.0* 0.3 - 1.2 mg/dL Final  . GFR, Est Non Af Am 11/06/2017 >60  >60 mL/min Final  . GFR, Est AFR Am 11/06/2017 >60  >60 mL/min Final   [name]: (NOTE) The eGFR has been calculated using the CKD EPI equation. This calculation has not been validated in all clinical situations. eGFR's persistently <60 mL/min signify possible Chronic Kidney Disease.   Georgiann Hahn gap 11/06/2017 8   5 - 15 Final   Performed at [facility], [address]  [place], [place] [address]    (this displays the last labs from the last 3 days)  No results found for: TOTALPROTELP, ALBUMINELP, A1GS, A2GS, BETS, BETA2SER, GAMS, MSPIKE, SPEI (this displays SPEP labs)  No results found for: KPAFRELGTCHN, LAMBDASER, KAPLAMBRATIO (kappa/lambda light chains)  No results found for: HGBA, HGBA2QUANT, HGBFQUANT, HGBSQUAN (Hemoglobinopathy evaluation)   No results found for: LDH  No results found for: IRON, TIBC, IRONPCTSAT (Iron and TIBC)  No results found for: FERRITIN  Urinalysis No results found for: COLORURINE, APPEARANCEUR, LABSPEC, PHURINE, GLUCOSEU, HGBUR, BILIRUBINUR, KETONESUR, PROTEINUR, UROBILINOGEN, NITRITE, LEUKOCYTESUR   STUDIES: Outside studies reviewed with the patient  ELIGIBLE FOR AVAILABLE RESEARCH PROTOCOL: no  ASSESSMENT: 72 y.o. [place], [place] woman status post left breast biopsy 10/24/2017 for ductal carcinoma in situ, grade 3, estrogen and progesterone receptor positive  (1) definitive surgery pending, no sentinel lymph node planned  (2) adjuvant radiation to follow  (3) consider antiestrogens at the completion of local treatment   PLAN: I spent approximately 60 minutes face to face with [name] with more than 50% of that time spent in counseling and coordination of care. Specifically we reviewed the biology of the patient's diagnosis and the specifics of her situation. [name] understands that in noninvasive ductal carcinoma, also called ductal carcinoma in situ ("DCIS") the breast cancer cells remain trapped in the ducts were they started. They cannot travel to a vital organ. For that reason these cancers in themselves are not life-threatening.  If the whole breast is removed then all the ducts are removed and since the cancer cells are trapped in the ducts, the cure rate with mastectomy for noninvasive breast cancer is approximately  99%. Nevertheless we recommend lumpectomy, because there is no survival advantage to mastectomy and because the cosmetic result is generally superior with breast conservation.  Since the patient is keeping her breast, there will be some risk of recurrence. The recurrence can only be in the same breast since, again, the cells are trapped in the ducts. There is no connection from one breast to the other. The risk of local recurrence is cut by more than half with radiation, which is standard in this situation.  In estrogen receptor positive cancers like [name], anti-estrogens can also be considered. They will further reduce the risk of recurrence by one half. In addition anti-estrogens will lower the risk of a new breast cancer developing in either breast, also by one half. That risk otherwise approaches 1% per year.   Accordingly the overall plan is for surgery, followed by radiation, then a discussion of anti-estrogens.  [name] has a good understanding of the overall plan. She agrees with it. She knows the goal of treatment in her case is cure. She will call with any problems that may develop before her next visit here.   Magrinat, Virgie Dad, MD  11/06/17 3:12 PM Medical Oncology and Hematology [facility] [address] [place], [place] [address] Tel. [phone]    Fax. [phone]  Alice Rieger, am acting as scribe for Chauncey Cruel MD.  I, Lurline Del MD, have reviewed the above documentation for accuracy and completeness, and I agree with the above.

## 2017-11-06 ENCOUNTER — Ambulatory Visit: Payer: Self-pay | Admitting: General Surgery

## 2017-11-06 ENCOUNTER — Inpatient Hospital Stay: Payer: Medicare Other

## 2017-11-06 ENCOUNTER — Inpatient Hospital Stay: Payer: Medicare Other | Attending: Oncology | Admitting: Oncology

## 2017-11-06 ENCOUNTER — Encounter: Payer: Self-pay | Admitting: General Practice

## 2017-11-06 ENCOUNTER — Ambulatory Visit: Payer: Medicare Other | Admitting: Physical Therapy

## 2017-11-06 ENCOUNTER — Other Ambulatory Visit: Payer: Self-pay | Admitting: *Deleted

## 2017-11-06 ENCOUNTER — Ambulatory Visit
Admission: RE | Admit: 2017-11-06 | Discharge: 2017-11-06 | Disposition: A | Discharge status: Home / Self Care | Payer: Medicare Other | Source: Ambulatory Visit | Attending: Radiation Oncology | Admitting: Radiation Oncology

## 2017-11-06 VITALS — BP 107/77 | HR 69 | Temp 97.5°F | Resp 16 | Ht 66.0 in | Wt 138.7 lb

## 2017-11-06 DIAGNOSIS — Z7982 Long term (current) use of aspirin: Secondary | ICD-10-CM

## 2017-11-06 DIAGNOSIS — I471 Supraventricular tachycardia: Secondary | ICD-10-CM | POA: Diagnosis not present

## 2017-11-06 DIAGNOSIS — Z170 Estrogen receptor positive status [ER+]: Secondary | ICD-10-CM | POA: Diagnosis not present

## 2017-11-06 DIAGNOSIS — D0512 Intraductal carcinoma in situ of left breast: Secondary | ICD-10-CM

## 2017-11-06 LAB — CBC WITH DIFFERENTIAL (CANCER CENTER ONLY)
Basophils Absolute: 0.1 10*3/uL (ref 0.0–0.1)
Basophils Relative: 1 %
EOS ABS: 0.1 10*3/uL (ref 0.0–0.5)
Eosinophils Relative: 1 %
HEMATOCRIT: 44.9 % (ref 34.8–46.6)
Hemoglobin: 14.8 g/dL (ref 11.6–15.9)
LYMPHS ABS: 2.3 10*3/uL (ref 0.9–3.3)
LYMPHS PCT: 29 %
MCH: 31.5 pg (ref 25.1–34.0)
MCHC: 32.9 g/dL (ref 31.5–36.0)
MCV: 95.7 fL (ref 79.5–101.0)
MONOS PCT: 7 %
Monocytes Absolute: 0.6 10*3/uL (ref 0.1–0.9)
Neutro Abs: 5 10*3/uL (ref 1.5–6.5)
Neutrophils Relative %: 62 %
Platelet Count: 165 10*3/uL (ref 145–400)
RBC: 4.69 MIL/uL (ref 3.70–5.45)
RDW: 13.2 % (ref 11.2–14.5)
WBC Count: 8.1 10*3/uL (ref 3.9–10.3)

## 2017-11-06 LAB — CMP (CANCER CENTER ONLY)
ALT: 14 U/L (ref 0–44)
ANION GAP: 8 (ref 5–15)
AST: 19 U/L (ref 15–41)
Albumin: 4 g/dL (ref 3.5–5.0)
Alkaline Phosphatase: 110 U/L (ref 38–126)
BUN: 21 mg/dL (ref 8–23)
CO2: 29 mmol/L (ref 22–32)
CREATININE: 0.91 mg/dL (ref 0.44–1.00)
Calcium: 9.9 mg/dL (ref 8.9–10.3)
Chloride: 103 mmol/L (ref 98–111)
GFR, Est AFR Am: >60 mL/min (ref >60)
Glucose, Bld: 110 mg/dL — ABNORMAL HIGH (ref 70–99)
Potassium: 4.7 mmol/L (ref 3.5–5.1)
Sodium: 140 mmol/L (ref 135–145)
Total Bilirubin: 2 mg/dL — ABNORMAL HIGH (ref 0.3–1.2)
Total Protein: 7 g/dL (ref 6.5–8.1)

## 2017-11-06 NOTE — Progress Notes (Signed)
Radiation Oncology         [phone] ________________________________  Multidisciplinary Breast Oncology Clinic [facility]) Initial Outpatient Consultation  Name: [name] MRN: [id number]  Date: 11/06/2017  DOB: Jul 03, 1945  LK:GMWNU, Hal Hope, MD  Jovita Kussmaul, MD   REFERRING PHYSICIAN: Autumn Messing III, MD  DIAGNOSIS: The encounter diagnosis was Ductal carcinoma in situ (DCIS) of left breast.   Stage 0, Tis Nx Mx, Left Breast, LIQ, Ductal Carcinoma In Situ, ER (+), PR (+), HER2 (not indicated), Intermediate to High grade   HISTORY OF PRESENT ILLNESS::[name] is a 72 y.o. female who is presenting to the office today for evaluation of her newly diagnosed breast cancer. She is doing well overall.   She had routine screening mammography on 10/15/2017 showing: The 2.5 cm area of grouped regional heterogeneous calcifications in the left breast are indeterminate.   She underwent unilateral left diagnostic mammography with tomography on 10/18/2017 showing: The new 3 cm area of grouped regional heterogeneous calcifications in the left breast are suspicious for malignancy.   Accordingly on 10/24/2017 she proceeded to biopsy of the left breast area in question. The pathology from this procedure showed: Breast, left, needle core biopsy, calcification with ductal carcinoma in situ with calcifications. Adenosis with calcifications. Prognostic indicators significant for: estrogen receptor, 95% positive and progesterone receptor, 95% positive, both with strong staining intensity.  On review of systems, She reports that she has to sleep propped up on 2 pillows at night. She denies any other symptoms.    Menarche: 72 years old Age at first live birth: 72 years old GP: GxP2 LMP: 2001 Contraceptive: Yes, 1970-1974 HRT: No   The patient was referred today for presentation in the multidisciplinary conference.  Radiology studies and pathology slides were presented there for review and  discussion of treatment options.  A consensus was discussed regarding potential next steps.  PREVIOUS RADIATION THERAPY: No  PAST MEDICAL HISTORY:  has a past medical history of Atypical angina ([place]) (08/17/2005), Dysrhythmia (02/09/2009), Hx of colonic polyps adenomas and ssp (10/09/2017), and Mitral valve prolapse.    PAST SURGICAL HISTORY: Past Surgical History:  Procedure Laterality Date  . BREAST BIOPSY    . COLONOSCOPY    . POLYPECTOMY    . TONSILLECTOMY      FAMILY HISTORY: family history includes Heart disease in her father.  SOCIAL HISTORY:  reports that she has never smoked. She has never used smokeless tobacco. She reports that she drinks alcohol. She reports that she does not use drugs.  ALLERGIES: Patient has no known allergies.  MEDICATIONS:  Current Outpatient Medications  Medication Sig Dispense Refill  . aspirin 81 MG tablet Take 81 mg by mouth once a week.     . Calcium-Vitamin D-Vitamin K (VIACTIV PO) Take by mouth.    . cholecalciferol (VITAMIN D) 1000 UNITS tablet Take 1,000 Units by mouth daily.    . Multiple Vitamin (MULTI-VITAMIN PO) Take by mouth.    . TOPROL XL 25 MG 24 hr tablet Take 1.5 tablets (37.5 mg total) by mouth daily. 135 tablet 3   No current facility-administered medications for this encounter.     REVIEW OF SYSTEMS:  REVIEW OF SYSTEMS: A 10+ POINT REVIEW OF SYSTEMS WAS OBTAINED including neurology, dermatology, psychiatry, cardiac, respiratory, lymph, extremities, GI, GU, musculoskeletal, constitutional, reproductive, HEENT. All pertinent positives are noted in the HPI. All others are negative.   PHYSICAL EXAM:  Vitals with BMI 11/06/2017  Height '5\' 6"'$   Weight 138 lbs  11 oz  BMI 67.2  Systolic 094  Diastolic 77  Pulse 69  Respirations 16    Lungs are clear to auscultation bilaterally. Heart has regular rate and rhythm. No palpable cervical, supraclavicular, or axillary adenopathy. Abdomen soft, non-tender, normal bowel sounds. Breast:  Right breast with no palpable mass, nipple discharge, or bleeding. Left breast with bruising and biopsy changes in the medial aspect of the breast at approximately the 8:30 position. No palpable mass, nipple discharge, or bleeding.    KPS = 100  100 - Normal; no complaints; no evidence of disease. 90   - Able to carry on normal activity; minor signs or symptoms of disease. 80   - Normal activity with effort; some signs or symptoms of disease. 4   - Cares for self; unable to carry on normal activity or to do active work. 60   - Requires occasional assistance, but is able to care for most of his personal needs. 50   - Requires considerable assistance and frequent medical care. 17   - Disabled; requires special care and assistance. 30   - Severely disabled; hospital admission is indicated although death not imminent. 35   - Very sick; hospital admission necessary; active supportive treatment necessary. 10   - Moribund; fatal processes progressing rapidly. 0     - Dead  Karnofsky DA, Abelmann [place], Craver LS and Burchenal [facility] [phone]) The use of the nitrogen mustards in the palliative treatment of carcinoma: with particular reference to bronchogenic carcinoma Cancer 1 634-56  LABORATORY DATA:  Lab Results  Component Value Date   WBC 8.1 11/06/2017   HGB 14.8 11/06/2017   HCT 44.9 11/06/2017   MCV 95.7 11/06/2017   PLT 165 11/06/2017   Lab Results  Component Value Date   NA 140 11/06/2017   K 4.7 11/06/2017   CL 103 11/06/2017   CO2 29 11/06/2017   Lab Results  Component Value Date   ALT 14 11/06/2017   AST 19 11/06/2017   ALKPHOS 110 11/06/2017   BILITOT 2.0 (H) 11/06/2017    RADIOGRAPHY: No results found.    IMPRESSION: Stage 0, Tis Nx Mx, Left Breast, LIQ, Ductal Carcinoma In Situ, ER (+), PR (+), HER2 (not indicated), Intermediate to High grade  Patient will be a good candidate for breast conservation with radiotherapy directed at the left breast. We are recommending a MRI  to further evaluate the extent of calcifications prior to her planned surgery. Patient does wish to have breast conserving surgery if possible and not a mastectomy.   Today, I talked to the patient and husband about the findings and work-up thus far.  We discussed the natural history of breast cancer and general treatment, highlighting the role of radiotherapy in the management.  We discussed the available radiation techniques, and focused on the details of logistics and delivery.  We reviewed the anticipated acute and late sequelae associated with radiation in this setting.  The patient was encouraged to ask questions that I answered to the best of my ability.    PLAN:  1. MRI of breast 2. Breast conserving surgery 3. Adjuvant radiation therapy 4. Adjuvant hormonal therapy    ------------------------------------------------  Blair Promise, PhD, MD   This document serves as a record of services personally performed by Gery Pray, MD. It was created on his behalf by [facility], a trained medical scribe. The creation of this record is based on the scribe's personal observations and the provider's statements to them. This  document has been checked and approved by the attending provider.

## 2017-11-07 ENCOUNTER — Telehealth: Payer: Self-pay | Admitting: Oncology

## 2017-11-07 ENCOUNTER — Telehealth: Payer: Self-pay

## 2017-11-07 NOTE — Progress Notes (Signed)
[place] Psychosocial Distress Screening Spiritual Care  Met with Kahmya and her husband in Breast Multidisciplinary Clinic to introduce [place] team/resources, reviewing distress screen per protocol.  The patient scored a 2 on the Psychosocial Distress Thermometer which indicates mild distress. Also assessed for distress and other psychosocial needs.   ONCBCN DISTRESS SCREENING 11/07/2017  Screening Type Initial Screening  Distress experienced in past week (1-10) 2  Referral to support programs Yes   Ms and Mr Langham verbalized appreciation for [facility], stating that their distress has decreased and that the team's clarity, efficiency, and support are very meaningful to them.  Follow up needed: No. Per couple, no other needs at this time. They are aware of ongoing [place] team/programming resources, but please also page if immediate needs arise or circumstances change. Thank you!   [place], [place], [facility] Pager [phone] Voicemail [phone]

## 2017-11-07 NOTE — Telephone Encounter (Signed)
   Primary Cardiologist: Dr. Claiborne Billings, 09/23/2017  Chart reviewed as part of pre-operative protocol coverage. Given past medical history and time since last visit, based on ACC/AHA guidelines, [name] would be at acceptable risk for the planned procedure without further cardiovascular testing.   I will route this recommendation to the requesting party via Epic fax function and remove from pre-op pool.  Please call with questions.  Rosaria Ferries, PA-C 11/07/2017, 5:54 PM

## 2017-11-07 NOTE — Telephone Encounter (Signed)
Scheduled appt per 8/28 sch message - sent reminder letter in the mail with appt date and time.  

## 2017-11-07 NOTE — Telephone Encounter (Signed)
Per 8/28 no los °

## 2017-11-07 NOTE — Telephone Encounter (Signed)
   [facility] Pre-operative Risk Assessment    Request for surgical clearance:  1. What type of surgery is being performed? Breast Lumpectomy   2. When is this surgery scheduled? TBD   3. What type of clearance is required (medical clearance vs. Pharmacy clearance to hold med vs. Both)? Both  4. Are there any medications that need to be held prior to surgery and how long? ASA   5. Practice name and name of physician performing surgery? [facility]   6. What is your office phone number [phone]    7.   What is your office fax number [phone] Attn: Carlene Coria, [place]  8.   Anesthesia type (None, local, MAC, general) ? General   [name] 11/07/2017, 2:50 PM  _________________________________________________________________   (provider comments below)

## 2017-11-08 NOTE — Telephone Encounter (Signed)
Call back staff: Marland Kitchen Recommendations have been sent to the requesting surgeon by Rosaria Ferries, PA-C. Marland Kitchen Please contact the surgeon's office to ensure it has been received. . This phone note will be removed from the preop pool. . Please sign encounter when completed.  Richardson Dopp, PA-C    11/08/2017 11:09 AM

## 2017-11-08 NOTE — Telephone Encounter (Signed)
Clearance letter not received yet. I was given another fax # to try [phone]. I will re-fax.

## 2017-11-10 HISTORY — PX: COLONOSCOPY: SHX174

## 2017-11-13 ENCOUNTER — Ambulatory Visit
Admission: RE | Admit: 2017-11-13 | Discharge: 2017-11-13 | Disposition: A | Discharge status: Home / Self Care | Payer: Medicare Other | Source: Ambulatory Visit | Attending: General Surgery | Admitting: General Surgery

## 2017-11-13 DIAGNOSIS — D0512 Intraductal carcinoma in situ of left breast: Secondary | ICD-10-CM | POA: Diagnosis not present

## 2017-11-13 MED ORDER — GADOBENATE DIMEGLUMINE 529 MG/ML IV SOLN
12.0000 mL | Freq: Once | INTRAVENOUS | Status: AC | PRN
  Administered 2017-11-13: 12 mL via INTRAVENOUS

## 2017-11-14 ENCOUNTER — Other Ambulatory Visit: Payer: Self-pay | Admitting: Oncology

## 2017-11-15 ENCOUNTER — Other Ambulatory Visit: Payer: Self-pay | Admitting: General Surgery

## 2017-11-15 DIAGNOSIS — R9389 Abnormal findings on diagnostic imaging of other specified body structures: Secondary | ICD-10-CM

## 2017-11-18 ENCOUNTER — Telehealth: Payer: Self-pay | Admitting: *Deleted

## 2017-11-18 NOTE — Telephone Encounter (Signed)
  Oncology Nurse Navigator Documentation  Navigator Location: [facility] (11/18/17 1100)   )Navigator Encounter Type: Telephone;[facility]C Follow-up (11/18/17 1100) Telephone: Outgoing Call;Clinic/[facility]C Follow-up (11/18/17 1100)                                                  Time Spent with Patient: 15 (11/18/17 1100)

## 2017-11-19 ENCOUNTER — Other Ambulatory Visit: Payer: Medicare Other

## 2017-11-19 DIAGNOSIS — D0512 Intraductal carcinoma in situ of left breast: Secondary | ICD-10-CM | POA: Diagnosis not present

## 2017-11-20 ENCOUNTER — Ambulatory Visit: Payer: Self-pay | Admitting: General Surgery

## 2017-11-20 DIAGNOSIS — D0512 Intraductal carcinoma in situ of left breast: Secondary | ICD-10-CM

## 2017-11-27 ENCOUNTER — Other Ambulatory Visit: Payer: Medicare Other

## 2017-11-29 ENCOUNTER — Encounter (HOSPITAL_BASED_OUTPATIENT_CLINIC_OR_DEPARTMENT_OTHER): Payer: Self-pay | Admitting: *Deleted

## 2017-11-29 ENCOUNTER — Other Ambulatory Visit: Payer: Self-pay

## 2017-12-04 NOTE — Progress Notes (Signed)
Ensure pre surgery drink given with instructions to complete by 0415 dos, surgical soap given with instructions, pt verbalized understanding. 

## 2017-12-06 ENCOUNTER — Ambulatory Visit (HOSPITAL_BASED_OUTPATIENT_CLINIC_OR_DEPARTMENT_OTHER): Payer: Medicare Other | Admitting: Anesthesiology

## 2017-12-06 ENCOUNTER — Encounter (HOSPITAL_BASED_OUTPATIENT_CLINIC_OR_DEPARTMENT_OTHER): Payer: Self-pay | Admitting: *Deleted

## 2017-12-06 ENCOUNTER — Ambulatory Visit (HOSPITAL_COMMUNITY)
Admission: RE | Admit: 2017-12-06 | Discharge: 2017-12-06 | Disposition: A | Discharge status: Home / Self Care | Payer: Medicare Other | Source: Ambulatory Visit | Attending: General Surgery | Admitting: General Surgery

## 2017-12-06 ENCOUNTER — Encounter (HOSPITAL_BASED_OUTPATIENT_CLINIC_OR_DEPARTMENT_OTHER)
Admission: RE | Disposition: A | Discharge status: Home / Self Care | Payer: Self-pay | Source: Ambulatory Visit | Attending: General Surgery

## 2017-12-06 ENCOUNTER — Ambulatory Visit (HOSPITAL_BASED_OUTPATIENT_CLINIC_OR_DEPARTMENT_OTHER)
Admission: RE | Admit: 2017-12-06 | Discharge: 2017-12-07 | Disposition: A | Discharge status: Home / Self Care | Payer: Medicare Other | Source: Ambulatory Visit | Attending: General Surgery | Admitting: General Surgery

## 2017-12-06 ENCOUNTER — Other Ambulatory Visit: Payer: Self-pay

## 2017-12-06 DIAGNOSIS — C50912 Malignant neoplasm of unspecified site of left female breast: Secondary | ICD-10-CM | POA: Diagnosis not present

## 2017-12-06 DIAGNOSIS — Z79899 Other long term (current) drug therapy: Secondary | ICD-10-CM | POA: Insufficient documentation

## 2017-12-06 DIAGNOSIS — G8918 Other acute postprocedural pain: Secondary | ICD-10-CM | POA: Diagnosis not present

## 2017-12-06 DIAGNOSIS — C50112 Malignant neoplasm of central portion of left female breast: Secondary | ICD-10-CM | POA: Insufficient documentation

## 2017-12-06 DIAGNOSIS — C50812 Malignant neoplasm of overlapping sites of left female breast: Secondary | ICD-10-CM | POA: Diagnosis present

## 2017-12-06 DIAGNOSIS — D0512 Intraductal carcinoma in situ of left breast: Secondary | ICD-10-CM | POA: Diagnosis not present

## 2017-12-06 DIAGNOSIS — Z170 Estrogen receptor positive status [ER+]: Secondary | ICD-10-CM | POA: Diagnosis not present

## 2017-12-06 DIAGNOSIS — Z7982 Long term (current) use of aspirin: Secondary | ICD-10-CM | POA: Diagnosis not present

## 2017-12-06 HISTORY — PX: MASTECTOMY W/ SENTINEL NODE BIOPSY: SHX2001

## 2017-12-06 HISTORY — DX: Malignant (primary) neoplasm, unspecified: C80.1

## 2017-12-06 SURGERY — MASTECTOMY WITH SENTINEL LYMPH NODE BIOPSY
Anesthesia: General | Site: Breast | Laterality: Left

## 2017-12-06 MED ORDER — FAMOTIDINE IN NACL 20-0.9 MG/50ML-% IV SOLN
20.0000 mg | Freq: Two times a day (BID) | INTRAVENOUS | Status: DC
  Administered 2017-12-06 – 2017-12-07 (×2): 20 mg via INTRAVENOUS
  Filled 2017-12-06 (×2): qty 50

## 2017-12-06 MED ORDER — BUPIVACAINE-EPINEPHRINE (PF) 0.5% -1:200000 IJ SOLN
INTRAMUSCULAR | Status: DC | PRN
  Administered 2017-12-06: 30 mL

## 2017-12-06 MED ORDER — HEPARIN SODIUM (PORCINE) 5000 UNIT/ML IJ SOLN
5000.0000 [IU] | Freq: Three times a day (TID) | INTRAMUSCULAR | Status: DC
  Administered 2017-12-07: 5000 [IU] via SUBCUTANEOUS

## 2017-12-06 MED ORDER — HYDROMORPHONE HCL 1 MG/ML IJ SOLN
INTRAMUSCULAR | Status: AC
  Filled 2017-12-06: qty 0.5

## 2017-12-06 MED ORDER — PROPOFOL 500 MG/50ML IV EMUL
INTRAVENOUS | Status: AC
  Filled 2017-12-06: qty 50

## 2017-12-06 MED ORDER — ACETAMINOPHEN 500 MG PO TABS
ORAL_TABLET | ORAL | Status: AC
  Filled 2017-12-06: qty 2

## 2017-12-06 MED ORDER — ACETAMINOPHEN 500 MG PO TABS: 1000.0000 mg | ORAL_TABLET | ORAL | Status: DC

## 2017-12-06 MED ORDER — METHOCARBAMOL 500 MG PO TABS: 500.0000 mg | ORAL_TABLET | Freq: Four times a day (QID) | ORAL | Status: DC | PRN

## 2017-12-06 MED ORDER — PHENYLEPHRINE 40 MCG/ML (10ML) SYRINGE FOR IV PUSH (FOR BLOOD PRESSURE SUPPORT)
PREFILLED_SYRINGE | INTRAVENOUS | Status: AC
  Filled 2017-12-06: qty 10

## 2017-12-06 MED ORDER — KCL IN DEXTROSE-NACL 20-5-0.9 MEQ/L-%-% IV SOLN
INTRAVENOUS | Status: DC
  Administered 2017-12-06: 10:00:00 via INTRAVENOUS
  Filled 2017-12-06: qty 1000

## 2017-12-06 MED ORDER — CLONIDINE HCL (ANALGESIA) 100 MCG/ML EP SOLN
EPIDURAL | Status: DC | PRN
  Administered 2017-12-06: 50 ug

## 2017-12-06 MED ORDER — CELECOXIB 200 MG PO CAPS: 200.0000 mg | ORAL_CAPSULE | ORAL | Status: DC

## 2017-12-06 MED ORDER — EPHEDRINE SULFATE-NACL 50-0.9 MG/10ML-% IV SOSY
PREFILLED_SYRINGE | INTRAVENOUS | Status: DC | PRN
  Administered 2017-12-06 (×5): 10 mg via INTRAVENOUS

## 2017-12-06 MED ORDER — CEFAZOLIN SODIUM-DEXTROSE 2-4 GM/100ML-% IV SOLN
2.0000 g | INTRAVENOUS | Status: AC
  Administered 2017-12-06: 2 g via INTRAVENOUS

## 2017-12-06 MED ORDER — SCOPOLAMINE 1 MG/3DAYS TD PT72: 1.0000 | MEDICATED_PATCH | Freq: Once | TRANSDERMAL | Status: DC | PRN

## 2017-12-06 MED ORDER — FENTANYL CITRATE (PF) 100 MCG/2ML IJ SOLN
INTRAMUSCULAR | Status: AC
  Filled 2017-12-06: qty 2

## 2017-12-06 MED ORDER — ONDANSETRON 4 MG PO TBDP
4.0000 mg | ORAL_TABLET | Freq: Four times a day (QID) | ORAL | Status: DC | PRN
  Administered 2017-12-06: 4 mg via ORAL
  Filled 2017-12-06: qty 1

## 2017-12-06 MED ORDER — ACETAMINOPHEN 500 MG PO TABS
1000.0000 mg | ORAL_TABLET | ORAL | Status: AC
  Administered 2017-12-06: 1000 mg via ORAL

## 2017-12-06 MED ORDER — LIDOCAINE 2% (20 MG/ML) 5 ML SYRINGE
INTRAMUSCULAR | Status: AC
  Filled 2017-12-06: qty 5

## 2017-12-06 MED ORDER — CHLORHEXIDINE GLUCONATE CLOTH 2 % EX PADS: 6.0000 | MEDICATED_PAD | Freq: Once | CUTANEOUS | Status: DC

## 2017-12-06 MED ORDER — FENTANYL CITRATE (PF) 100 MCG/2ML IJ SOLN
INTRAMUSCULAR | Status: DC | PRN
  Administered 2017-12-06 (×2): 25 ug via INTRAVENOUS

## 2017-12-06 MED ORDER — MORPHINE SULFATE (PF) 4 MG/ML IV SOLN: 1.0000 mg | INTRAVENOUS | Status: DC | PRN

## 2017-12-06 MED ORDER — GABAPENTIN 300 MG PO CAPS
ORAL_CAPSULE | ORAL | Status: AC
  Filled 2017-12-06: qty 1

## 2017-12-06 MED ORDER — CEFAZOLIN SODIUM-DEXTROSE 2-4 GM/100ML-% IV SOLN: 2.0000 g | INTRAVENOUS | Status: DC

## 2017-12-06 MED ORDER — PHENYLEPHRINE 40 MCG/ML (10ML) SYRINGE FOR IV PUSH (FOR BLOOD PRESSURE SUPPORT)
PREFILLED_SYRINGE | INTRAVENOUS | Status: DC | PRN
  Administered 2017-12-06 (×2): 80 ug via INTRAVENOUS

## 2017-12-06 MED ORDER — HYDROCODONE-ACETAMINOPHEN 5-325 MG PO TABS: 1.0000 | ORAL_TABLET | Freq: Four times a day (QID) | ORAL | 0 refills | Status: DC | PRN

## 2017-12-06 MED ORDER — CEFAZOLIN SODIUM-DEXTROSE 2-4 GM/100ML-% IV SOLN
INTRAVENOUS | Status: AC
  Filled 2017-12-06: qty 100

## 2017-12-06 MED ORDER — CELECOXIB 200 MG PO CAPS
200.0000 mg | ORAL_CAPSULE | ORAL | Status: AC
  Administered 2017-12-06: 200 mg via ORAL

## 2017-12-06 MED ORDER — MIDAZOLAM HCL 2 MG/2ML IJ SOLN
1.0000 mg | INTRAMUSCULAR | Status: DC | PRN
  Administered 2017-12-06: 1 mg via INTRAVENOUS

## 2017-12-06 MED ORDER — PROPOFOL 10 MG/ML IV BOLUS
INTRAVENOUS | Status: DC | PRN
  Administered 2017-12-06: 150 mg via INTRAVENOUS

## 2017-12-06 MED ORDER — CELECOXIB 200 MG PO CAPS
ORAL_CAPSULE | ORAL | Status: AC
  Filled 2017-12-06: qty 1

## 2017-12-06 MED ORDER — EPHEDRINE 5 MG/ML INJ
INTRAVENOUS | Status: AC
  Filled 2017-12-06: qty 10

## 2017-12-06 MED ORDER — ONDANSETRON HCL 4 MG/2ML IJ SOLN
INTRAMUSCULAR | Status: DC | PRN
  Administered 2017-12-06: 4 mg via INTRAVENOUS

## 2017-12-06 MED ORDER — ONDANSETRON HCL 4 MG/2ML IJ SOLN
INTRAMUSCULAR | Status: AC
  Filled 2017-12-06: qty 2

## 2017-12-06 MED ORDER — HYDROCODONE-ACETAMINOPHEN 5-325 MG PO TABS
1.0000 | ORAL_TABLET | ORAL | Status: DC | PRN
  Administered 2017-12-06 (×2): 1 via ORAL
  Filled 2017-12-06 (×2): qty 1

## 2017-12-06 MED ORDER — MIDAZOLAM HCL 2 MG/2ML IJ SOLN
INTRAMUSCULAR | Status: AC
  Filled 2017-12-06: qty 2

## 2017-12-06 MED ORDER — GABAPENTIN 300 MG PO CAPS
300.0000 mg | ORAL_CAPSULE | ORAL | Status: AC
  Administered 2017-12-06: 300 mg via ORAL

## 2017-12-06 MED ORDER — HYDROMORPHONE HCL 1 MG/ML IJ SOLN
0.2500 mg | INTRAMUSCULAR | Status: DC | PRN
  Administered 2017-12-06 (×2): 0.25 mg via INTRAVENOUS

## 2017-12-06 MED ORDER — METOPROLOL SUCCINATE ER 25 MG PO TB24: 37.5000 mg | ORAL_TABLET | Freq: Every day | ORAL | Status: DC

## 2017-12-06 MED ORDER — LACTATED RINGERS IV SOLN
INTRAVENOUS | Status: DC
  Administered 2017-12-06 (×2): via INTRAVENOUS

## 2017-12-06 MED ORDER — LIDOCAINE 2% (20 MG/ML) 5 ML SYRINGE
INTRAMUSCULAR | Status: DC | PRN
  Administered 2017-12-06: 60 mg via INTRAVENOUS

## 2017-12-06 MED ORDER — GABAPENTIN 300 MG PO CAPS: 300.0000 mg | ORAL_CAPSULE | ORAL | Status: DC

## 2017-12-06 MED ORDER — FENTANYL CITRATE (PF) 100 MCG/2ML IJ SOLN
50.0000 ug | INTRAMUSCULAR | Status: DC | PRN
  Administered 2017-12-06 (×2): 50 ug via INTRAVENOUS

## 2017-12-06 MED ORDER — TECHNETIUM TC 99M SULFUR COLLOID FILTERED
1.0000 | Freq: Once | INTRAVENOUS | Status: AC | PRN
  Administered 2017-12-06: 1 via INTRADERMAL

## 2017-12-06 MED ORDER — ONDANSETRON HCL 4 MG/2ML IJ SOLN: 4.0000 mg | Freq: Four times a day (QID) | INTRAMUSCULAR | Status: DC | PRN

## 2017-12-06 SURGICAL SUPPLY — 57 items
APPLIER CLIP 9.375 MED OPEN (MISCELLANEOUS) ×4
BINDER BREAST LRG (GAUZE/BANDAGES/DRESSINGS) ×2 IMPLANT
BIOPATCH RED 1 DISK 7.0 (GAUZE/BANDAGES/DRESSINGS) ×2 IMPLANT
BLADE SURG 10 STRL SS (BLADE) ×2 IMPLANT
BLADE SURG 15 STRL LF DISP TIS (BLADE) ×1 IMPLANT
BLADE SURG 15 STRL SS (BLADE) ×1
CANISTER SUCT 1200ML W/VALVE (MISCELLANEOUS) ×2 IMPLANT
CHLORAPREP W/TINT 26ML (MISCELLANEOUS) ×2 IMPLANT
CLIP APPLIE 9.375 MED OPEN (MISCELLANEOUS) ×2 IMPLANT
COVER BACK TABLE 60X90IN (DRAPES) ×2 IMPLANT
COVER MAYO STAND STRL (DRAPES) ×2 IMPLANT
COVER PROBE W GEL 5X96 (DRAPES) ×2 IMPLANT
DECANTER SPIKE VIAL GLASS SM (MISCELLANEOUS) IMPLANT
DERMABOND ADVANCED (GAUZE/BANDAGES/DRESSINGS) ×1
DERMABOND ADVANCED .7 DNX12 (GAUZE/BANDAGES/DRESSINGS) ×1 IMPLANT
DEVICE DISSECT PLASMABLAD 3.0S (MISCELLANEOUS) ×1 IMPLANT
DRAIN CHANNEL 19F RND (DRAIN) ×2 IMPLANT
DRAPE LAPAROSCOPIC ABDOMINAL (DRAPES) ×2 IMPLANT
DRAPE SURG 17X23 STRL (DRAPES) IMPLANT
DRAPE UTILITY XL STRL (DRAPES) ×2 IMPLANT
DRSG PAD ABDOMINAL 8X10 ST (GAUZE/BANDAGES/DRESSINGS) ×2 IMPLANT
ELECT COATED BLADE 2.86 ST (ELECTRODE) ×2 IMPLANT
ELECT REM PT RETURN 9FT ADLT (ELECTROSURGICAL) ×2
ELECTRODE REM PT RTRN 9FT ADLT (ELECTROSURGICAL) ×1 IMPLANT
EVACUATOR SILICONE 100CC (DRAIN) ×2 IMPLANT
GAUZE SPONGE 4X4 12PLY STRL LF (GAUZE/BANDAGES/DRESSINGS) ×4 IMPLANT
GLOVE BIO SURGEON STRL SZ7.5 (GLOVE) ×2 IMPLANT
GLOVE BIOGEL PI IND STRL 7.0 (GLOVE) ×1 IMPLANT
GLOVE BIOGEL PI IND STRL 7.5 (GLOVE) ×1 IMPLANT
GLOVE BIOGEL PI INDICATOR 7.0 (GLOVE) ×1
GLOVE BIOGEL PI INDICATOR 7.5 (GLOVE) ×1
GLOVE ECLIPSE 7.5 STRL STRAW (GLOVE) ×2 IMPLANT
GOWN STRL REUS W/ TWL LRG LVL3 (GOWN DISPOSABLE) ×2 IMPLANT
GOWN STRL REUS W/ TWL XL LVL3 (GOWN DISPOSABLE) ×2 IMPLANT
GOWN STRL REUS W/TWL LRG LVL3 (GOWN DISPOSABLE) ×2
GOWN STRL REUS W/TWL XL LVL3 (GOWN DISPOSABLE) ×2
ILLUMINATOR WAVEGUIDE N/F (MISCELLANEOUS) IMPLANT
LIGHT WAVEGUIDE WIDE FLAT (MISCELLANEOUS) IMPLANT
NEEDLE HYPO 25X1 1.5 SAFETY (NEEDLE) ×2 IMPLANT
NS IRRIG 1000ML POUR BTL (IV SOLUTION) ×2 IMPLANT
PACK BASIN DAY SURGERY FS (CUSTOM PROCEDURE TRAY) ×2 IMPLANT
PENCIL BUTTON HOLSTER BLD 10FT (ELECTRODE) ×2 IMPLANT
PIN SAFETY STERILE (MISCELLANEOUS) ×2 IMPLANT
PLASMABLADE 3.0S (MISCELLANEOUS) ×2
SLEEVE SCD COMPRESS KNEE MED (MISCELLANEOUS) ×2 IMPLANT
SPONGE LAP 18X18 RF (DISPOSABLE) ×4 IMPLANT
SUT ETHILON 2 0 FS 18 (SUTURE) ×2 IMPLANT
SUT MNCRL AB 4-0 PS2 18 (SUTURE) ×2 IMPLANT
SUT SILK 2 0 SH (SUTURE) ×2 IMPLANT
SUT VIC AB 0 SH 27 (SUTURE) ×2 IMPLANT
SUT VICRYL 3-0 CR8 SH (SUTURE) ×4 IMPLANT
SYR CONTROL 10ML LL (SYRINGE) ×2 IMPLANT
TOWEL GREEN STERILE FF (TOWEL DISPOSABLE) ×2 IMPLANT
TOWEL OR NON WOVEN STRL DISP B (DISPOSABLE) IMPLANT
TRAY FOLEY W/BAG SLVR 14FR LF (SET/KITS/TRAYS/PACK) IMPLANT
TUBE CONNECTING 20X1/4 (TUBING) ×2 IMPLANT
YANKAUER SUCT BULB TIP NO VENT (SUCTIONS) ×2 IMPLANT

## 2017-12-06 NOTE — H&P (Signed)
[name]  Location: [facility] Patient #: [id number] DOB: [birth year] Married / Language: English / Race: White Female   History of Present Illness  The patient is a 72 year old female who presents for a follow-up for Breast cancer. The patient is a 72 year old white female who was recently diagnosed with a 3 cm area of ductal carcinoma in situ in the 6 o'clock position of the left breast. The cancer was ER and PR positive. She then underwent an MRI study which showed enhancement at the site of the known area of DCIS as well as enhancement in the lower outer and upper inner quadrants of the left breast. These other areas have not been biopsied yet.   Allergies  No Known Drug Allergies   Medication History  Toprol XL (25MG  Tablet ER 24HR, 1.5 tabs Oral daily) Active. Aspirin (81MG  Tablet, one Oral once a week) Active. Cholecalciferol (1000UNIT Tablet, one Oral daily) Active. Calcium-Vitamin D-Vitamin K (500-100-40MG -UNT-MCG Tablet Chewable, Oral) Active. Medications Reconciled    Review of Systems  General Not Present- Appetite Loss, Chills, Fatigue, Fever, Night Sweats, Weight Gain and Weight Loss. Skin Not Present- Change in Wart/Mole, Dryness, Hives, Jaundice, New Lesions, Non-Healing Wounds, Rash and Ulcer. HEENT Not Present- Earache, Hearing Loss, Hoarseness, Nose Bleed, Oral Ulcers, Ringing in the Ears, Seasonal Allergies, Sinus Pain, Sore Throat, Visual Disturbances, Wears glasses/contact lenses and Yellow Eyes. Respiratory Not Present- Bloody sputum, Chronic Cough, Difficulty Breathing, Snoring and Wheezing. Breast Not Present- Breast Mass, Breast Pain, Nipple Discharge and Skin Changes. Cardiovascular Not Present- Chest Pain, Difficulty Breathing Lying Down, Leg Cramps, Palpitations, Rapid Heart Rate, Shortness of Breath and Swelling of Extremities. Gastrointestinal Not Present- Abdominal Pain, Bloating, Bloody Stool, Change in Bowel Habits, Chronic  diarrhea, Constipation, Difficulty Swallowing, Excessive gas, Gets full quickly at meals, Hemorrhoids, Indigestion, Nausea, Rectal Pain and Vomiting. Female Genitourinary Not Present- Frequency, Nocturia, Painful Urination, Pelvic Pain and Urgency. Musculoskeletal Not Present- Back Pain, Joint Pain, Joint Stiffness, Muscle Pain, Muscle Weakness and Swelling of Extremities. Neurological Not Present- Decreased Memory, Fainting, Headaches, Numbness, Seizures, Tingling, Tremor, Trouble walking and Weakness. Psychiatric Not Present- Anxiety, Bipolar, Change in Sleep Pattern, Depression, Fearful and Frequent crying. Endocrine Not Present- Cold Intolerance, Excessive Hunger, Hair Changes, Heat Intolerance, Hot flashes and New Diabetes. Hematology Not Present- Blood Thinners, Easy Bruising, Excessive bleeding, Gland problems, HIV and Persistent Infections.  Vitals Weight: 139.8 lb Temp.: 98.86F(Oral)  BP: 120/78 (Sitting, Left Arm, Standard)       Physical Exam  General Mental Status-Alert. General Appearance-Consistent with stated age. Hydration-Well hydrated. Voice-Normal.  Head and Neck Head-normocephalic, atraumatic with no lesions or palpable masses. Trachea-midline. Thyroid Gland Characteristics - normal size and consistency.  Eye Eyeball - Bilateral-Extraocular movements intact. Sclera/Conjunctiva - Bilateral-No scleral icterus.  Chest and Lung Exam Chest and lung exam reveals -quiet, even and easy respiratory effort with no use of accessory muscles and on auscultation, normal breath sounds, no adventitious sounds and normal vocal resonance. Inspection Chest Wall - Normal. Back - normal.  Breast Note: There is no palpable mass in either breast. there is no palpable axillary, supraclavicular, or cervical lymphadenopathy.   Cardiovascular Cardiovascular examination reveals -normal heart sounds, regular rate and rhythm with no murmurs and normal  pedal pulses bilaterally.  Abdomen Inspection Inspection of the abdomen reveals - No Hernias. Skin - Scar - no surgical scars. Palpation/Percussion Palpation and Percussion of the abdomen reveal - Soft, Non Tender, No Rebound tenderness, No Rigidity (guarding) and No hepatosplenomegaly. Auscultation Auscultation  of the abdomen reveals - Bowel sounds normal.  Neurologic Neurologic evaluation reveals -alert and oriented x 3 with no impairment of recent or remote memory. Mental Status-Normal.  Musculoskeletal Normal Exam - Left-Upper Extremity Strength Normal and Lower Extremity Strength Normal. Normal Exam - Right-Upper Extremity Strength Normal and Lower Extremity Strength Normal.  Lymphatic Head & Neck  General Head & Neck Lymphatics: Bilateral - Description - Normal. Axillary  General Axillary Region: Bilateral - Description - Normal. Tenderness - Non Tender. Femoral & Inguinal  Generalized Femoral & Inguinal Lymphatics: Bilateral - Description - Normal. Tenderness - Non Tender.    Assessment & Plan DUCTAL CARCINOMA IN SITU (DCIS) OF LEFT BREAST (D05.12) Impression: The patient has a known area of ductal carcinoma in situ measuring about 3 cm in the lower portion of the left breast. Her recent MRI showed 2 other areas of enhancement that looked very similar. At this point if she is considering breast conservation and these other areas should be biopsied. If she chooses a mastectomy then we could forego the biopsies. She is going to think about the choices and will decide in the near future and let us know. What she decides on the surgery then we can proceed with scheduling. I have discussed with her in detail the risks and benefits of both operations including the technical aspects and she understands.

## 2017-12-06 NOTE — Progress Notes (Signed)
Nuc med staff performed nuc med inj. Pt tol well with additional fentanyl for comfort. Will call husband to bedside and update/provide emotional support.

## 2017-12-06 NOTE — Anesthesia Postprocedure Evaluation (Signed)
Anesthesia Post Note  Patient: [name]  Procedure(s) Performed: MASTECTOMY WITH SENTINEL LYMPH NODE BIOPSY (Left Breast)     Patient location during evaluation: PACU Anesthesia Type: General and Regional Level of consciousness: awake and alert Pain management: pain level controlled Vital Signs Assessment: post-procedure vital signs reviewed and stable Respiratory status: spontaneous breathing, nonlabored ventilation, respiratory function stable and patient connected to nasal cannula oxygen Cardiovascular status: blood pressure returned to baseline and stable Postop Assessment: no apparent nausea or vomiting Anesthetic complications: no    Last Vitals:  Vitals:   12/06/17 1034 12/06/17 1130  BP:    Pulse:    Resp:    Temp:    SpO2: 94% 99%    Last Pain:  Vitals:   12/06/17 1130  TempSrc:   PainSc: 3                  Shiesha Jahn,W. EDMOND

## 2017-12-06 NOTE — Interval H&P Note (Signed)
History and Physical Interval Note:  12/06/2017 7:15 AM  [name]  has presented today for surgery, with the diagnosis of LEFT BREAST DUCTAL CARCINOMA IN SITU  The various methods of treatment have been discussed with the patient and family. After consideration of risks, benefits and other options for treatment, the patient has consented to  Procedure(s): MASTECTOMY WITH SENTINEL LYMPH NODE BIOPSY (Left) as a surgical intervention .  The patient's history has been reviewed, patient examined, no change in status, stable for surgery.  I have reviewed the patient's chart and labs.  Questions were answered to the patient's satisfaction.     Autumn Messing III

## 2017-12-06 NOTE — Transfer of Care (Signed)
Immediate Anesthesia Transfer of Care Note  Patient: [name]  Procedure(s) Performed: MASTECTOMY WITH SENTINEL LYMPH NODE BIOPSY (Left Breast)  Patient Location: PACU  Anesthesia Type:General  Level of Consciousness: sedated  Airway & Oxygen Therapy: Patient Spontanous Breathing and Patient connected to face mask oxygen  Post-op Assessment: Report given to RN and Post -op Vital signs reviewed and stable  Post vital signs: Reviewed and stable  Last Vitals:  Vitals Value Taken Time  BP 108/70 12/06/2017  9:25 AM  Temp    Pulse 72 12/06/2017  9:27 AM  Resp 12 12/06/2017  9:27 AM  SpO2 100 % 12/06/2017  9:27 AM  Vitals shown include unvalidated device data.  Last Pain:  Vitals:   12/06/17 0635  TempSrc: Oral  PainSc: 0-No pain         Complications: No apparent anesthesia complications

## 2017-12-06 NOTE — Progress Notes (Signed)
Report given to Carmine Savoy, RN.

## 2017-12-06 NOTE — Progress Notes (Signed)
Assisted Dr. Oren Bracket with left, ultrasound guided, pectoralis block. Side rails up, monitors on throughout procedure. See vital signs in flow sheet. Tolerated Procedure well.

## 2017-12-06 NOTE — Anesthesia Preprocedure Evaluation (Addendum)
Anesthesia Evaluation  Patient identified by MRN, date of birth, ID band Patient awake    Reviewed: Allergy & Precautions, H&P , NPO status , Patient's Chart, lab work & pertinent test results  Airway Mallampati: I  TM Distance: >3 FB Neck ROM: Full    Dental no notable dental hx. (+) Teeth Intact, Dental Advisory Given   Pulmonary neg pulmonary ROS,    Pulmonary exam normal breath sounds clear to auscultation       Cardiovascular negative cardio ROS   Rhythm:Regular Rate:Normal     Neuro/Psych negative neurological ROS  negative psych ROS   GI/Hepatic negative GI ROS, Neg liver ROS,   Endo/Other  negative endocrine ROS  Renal/GU negative Renal ROS  negative genitourinary   Musculoskeletal   Abdominal   Peds  Hematology negative hematology ROS (+)   Anesthesia Other Findings   Reproductive/Obstetrics negative OB ROS                            Anesthesia Physical Anesthesia Plan  ASA: II  Anesthesia Plan: General   Post-op Pain Management:  Regional for Post-op pain   Induction: Intravenous  PONV Risk Score and Plan: 4 or greater and Ondansetron, Dexamethasone and Midazolam  Airway Management Planned: LMA  Additional Equipment:   Intra-op Plan:   Post-operative Plan: Extubation in OR  Informed Consent: I have reviewed the patients History and Physical, chart, labs and discussed the procedure including the risks, benefits and alternatives for the proposed anesthesia with the patient or authorized representative who has indicated his/her understanding and acceptance.   Dental advisory given  Plan Discussed with: CRNA  Anesthesia Plan Comments:         Anesthesia Quick Evaluation

## 2017-12-06 NOTE — Anesthesia Procedure Notes (Signed)
Anesthesia Regional Block: Pectoralis block   Pre-Anesthetic Checklist: ,, timeout performed, Correct Patient, Correct Site, Correct Laterality, Correct Procedure, Correct Position, site marked, Risks and benefits discussed, pre-op evaluation,  At surgeon's request and post-op pain management  Laterality: Left  Prep: Maximum Sterile Barrier Precautions used, chloraprep       Needles:  Injection technique: Single-shot  Needle Type: Echogenic Stimulator Needle     Needle Length: 9cm  Needle Gauge: 21     Additional Needles:   Procedures:,,,, ultrasound used (permanent image in chart),,,,  Narrative:  Start time: 12/06/2017 7:09 AM End time: 12/06/2017 7:19 AM Injection made incrementally with aspirations every 5 mL. Anesthesiologist: Roderic Palau, MD  Additional Notes: 2% Lidocaine skin wheel.

## 2017-12-06 NOTE — Anesthesia Procedure Notes (Signed)
Procedure Name: LMA Insertion Date/Time: 12/06/2017 7:41 AM Performed by: Lieutenant Diego, CRNA Pre-anesthesia Checklist: Patient identified, Emergency Drugs available, Suction available and Patient being monitored Patient Re-evaluated:Patient Re-evaluated prior to induction Oxygen Delivery Method: Circle system utilized Preoxygenation: Pre-oxygenation with 100% oxygen Induction Type: IV induction Ventilation: Mask ventilation without difficulty LMA: LMA inserted LMA Size: 4.0 Number of attempts: 1 Placement Confirmation: positive ETCO2 and breath sounds checked- equal and bilateral Tube secured with: Tape Dental Injury: Teeth and Oropharynx as per pre-operative assessment

## 2017-12-06 NOTE — Op Note (Signed)
12/06/2017  9:15 AM  PATIENT:  [name]  72 y.o. female  PRE-OPERATIVE DIAGNOSIS:  LEFT BREAST DUCTAL CARCINOMA IN SITU  POST-OPERATIVE DIAGNOSIS:  LEFT BREAST DUCTAL CARCINOMA IN SITU  PROCEDURE:  Procedure(s): LEFT MASTECTOMY WITH DEEP LEFT AXILLARY SENTINEL LYMPH NODE BIOPSY (Left)  SURGEON:  Surgeon(s) and Role:    * Jovita Kussmaul, MD - Primary  PHYSICIAN ASSISTANT:   ASSISTANTS: none   ANESTHESIA:   general  EBL:  15 mL   BLOOD ADMINISTERED:none  DRAINS: (1) Jackson-Pratt drain(s) with closed bulb suction in the prepectoral space   LOCAL MEDICATIONS USED:  NONE  SPECIMEN:  Source of Specimen:  left breast and sentinel nodes X 5  DISPOSITION OF SPECIMEN:  PATHOLOGY  COUNTS:  YES  TOURNIQUET:  * No tourniquets in log *  DICTATION: .Dragon Dictation   After informed consent was obtained the patient was brought to the operating room and placed in the supine position on the operating table.  After adequate induction of general anesthesia the patient's left chest, breast, and axillary area were prepped with ChloraPrep, allowed to dry, and draped in usual sterile manner.  An appropriate timeout was performed.  Earlier in the day the patient underwent injection of 1 mCi of technetium sulfur colloid in the subareolar position on the left.  The neoprobe was set to technetium in an area of radioactivity was identified in the left axilla.  Next an elliptical incision was made around the nipple and areola complex in order to minimize the excess skin.  The incision was carried through the skin and subcutaneous tissue sharply with the plasma blade.  Breast hooks were used to elevate the skin flaps anteriorly towards the saline.  Thin skin flaps were then created circumferentially by dissection with the plasma blade between the breast tissue in the subcutaneous fat.  This dissection was carried all the way to the chest wall.  Next the breast was removed from the pectoralis muscle  with the pectoralis fascia.  Once the breast was completely detached it was oriented with a stitch on the lateral skin and sent to pathology for further evaluation.  Dissection was then carried into the deep left axillary space with the plasma blade under the direction of the neoprobe.  I was able to identify 5 lymph nodes with increased radioactivity.  Each of these lymph nodes was excised sharply with the plasma blade and the lymphatics and small vessels were controlled with clips.  Ex vivo counts on these nodes ranged from 400 to 2000.  These were sent as sentinel nodes numbers 1 through 5.  No other hot or palpable lymph nodes were identified in the left axilla.  Hemostasis was achieved using the plasma blade.  The wound was irrigated with copious amounts of saline.  The lateral axillary tissue was then reapproximated to the chest wall with a running 0 Vicryl stitch.  A small stab incision was made near the anterior axillary line inferior to the operative bed with a 15 blade knife.  A tonsil clamp was placed through this opening and used to bring a 19 [place] round Blake drain into the operative bed.  The drain was anchored to the skin with a 3-0 nylon stitch.  The drain was curled along the chest wall and the tip of the drain was placed in the axilla.  Next the superior and inferior skin flaps were grossly reapproximated with interrupted 3-0 Vicryl stitches.  The skin was then closed with a running 4-0  Monocryl subcuticular stitch.  Dermabond dressings were applied.  Patient tolerated the procedure well.  At the end of the case all needle sponge and instrument counts were correct.  The patient was then awakened and taken to recovery in stable condition.  The drain was placed to bulb suction and there was a good seal.  The skin flaps were viable and healthy appearing at the end of the case.  PLAN OF CARE: Admit for overnight observation  PATIENT DISPOSITION:  PACU - hemodynamically stable.   Delay start of  Pharmacological VTE agent (>24hrs) due to surgical blood loss or risk of bleeding: no

## 2017-12-07 DIAGNOSIS — Z79899 Other long term (current) drug therapy: Secondary | ICD-10-CM | POA: Diagnosis not present

## 2017-12-07 DIAGNOSIS — Z7982 Long term (current) use of aspirin: Secondary | ICD-10-CM | POA: Diagnosis not present

## 2017-12-07 DIAGNOSIS — C50112 Malignant neoplasm of central portion of left female breast: Secondary | ICD-10-CM | POA: Diagnosis not present

## 2017-12-07 MED ORDER — HEPARIN SODIUM (PORCINE) 5000 UNIT/ML IJ SOLN
INTRAMUSCULAR | Status: AC
  Filled 2017-12-07: qty 1

## 2017-12-07 NOTE — Discharge Instructions (Signed)
[facility] °[phone] ° °MASTECTOMY: POST OP INSTRUCTIONS ° °Always review your discharge instruction sheet given to you by the facility where your surgery was performed. °IF YOU HAVE DISABILITY OR FAMILY LEAVE FORMS, YOU MUST BRING THEM TO THE OFFICE FOR PROCESSING.   °DO NOT GIVE THEM TO YOUR DOCTOR. °A prescription for pain medication may be given to you upon discharge.  Take your pain medication as prescribed, if needed.  If narcotic pain medicine is not needed, then you may take acetaminophen (Tylenol) or ibuprofen (Advil) as needed. °1. Take your usually prescribed medications unless otherwise directed. °2. If you need a refill on your pain medication, please contact your pharmacy.  They will contact our office to request authorization.  Prescriptions will not be filled after 5pm or on week-ends. °3. You should follow a light diet the first few days after arrival home, such as soup and crackers, etc.  Resume your normal diet the day after surgery. °4. Most patients will experience some swelling and bruising on the chest and underarm.  Ice packs will help.  Swelling and bruising can take several days to resolve.  °5. It is common to experience some constipation if taking pain medication after surgery.  Increasing fluid intake and taking a stool softener (such as Colace) will usually help or prevent this problem from occurring.  A mild laxative (Milk of Magnesia or Miralax) should be taken according to package instructions if there are no bowel movements after 48 hours. °6. Unless discharge instructions indicate otherwise, leave your bandage dry and in place until your next appointment in 3-5 days.  You may take a limited sponge bath.  No tube baths or showers until the drains are removed.  You may have steri-strips (small skin tapes) in place directly over the incision.  These strips should be left on the skin for 7-10 days.  If your surgeon used skin glue on the incision, you may  shower in 24 hours.  The glue will flake off over the next 2-3 weeks.  Any sutures or staples will be removed at the office during your follow-up visit. °7. DRAINS:  If you have drains in place, it is important to keep a list of the amount of drainage produced each day in your drains.  Before leaving the hospital, you should be instructed on drain care.  Call our office if you have any questions about your drains. °8. ACTIVITIES:  You may resume regular (light) daily activities beginning the next day--such as daily self-care, walking, climbing stairs--gradually increasing activities as tolerated.  You may have sexual intercourse when it is comfortable.  Refrain from any heavy lifting or straining until approved by your doctor. °a. You may drive when you are no longer taking prescription pain medication, you can comfortably wear a seatbelt, and you can safely maneuver your car and apply brakes. °b. RETURN TO WORK:  __________________________________________________________ °9. You should see your doctor in the office for a follow-up appointment approximately 3-5 days after your surgery.  Your doctor’s nurse will typically make your follow-up appointment when she calls you with your pathology report.  Expect your pathology report 2-3 business days after your surgery.  You may call to check if you do not hear from us after three days.   °10. OTHER INSTRUCTIONS: ______________________________________________________________________________________________ ____________________________________________________________________________________________ °WHEN TO CALL YOUR DOCTOR: °1. Fever over 101.0 °2. Nausea and/or vomiting °3. Extreme swelling or bruising °4. Continued bleeding from incision. °5. Increased pain, redness, or drainage from the incision. °  The clinic staff is available to answer your questions during regular business hours.  Please don’t hesitate to call and ask to speak to one of the nurses for clinical  concerns.  If you have a medical emergency, go to the nearest emergency room or call 911.  A surgeon from [facility] is always on call at the hospital. °[address], [address], [place], [place]  [address] ? [address] [address], [place], [place]   [address] °[phone] ? [phone] ? FAX [phone] °Web site: [facility] ° ° °Post Anesthesia Home Care Instructions ° °Activity: °Get plenty of rest for the remainder of the day. A responsible individual must stay with you for 24 hours following the procedure.  °For the next 24 hours, DO NOT: °-Drive a car °-Operate machinery °-Drink alcoholic beverages °-Take any medication unless instructed by your physician °-Make any legal decisions or sign important papers. ° °Meals: °Start with liquid foods such as gelatin or soup. Progress to regular foods as tolerated. Avoid greasy, spicy, heavy foods. If nausea and/or vomiting occur, drink only clear liquids until the nausea and/or vomiting subsides. Call your physician if vomiting continues. ° °Special Instructions/Symptoms: °Your throat may feel dry or sore from the anesthesia or the breathing tube placed in your throat during surgery. If this causes discomfort, gargle with warm salt water. The discomfort should disappear within 24 hours. ° °If you had a scopolamine patch placed behind your ear for the management of post- operative nausea and/or vomiting: ° °1. The medication in the patch is effective for 72 hours, after which it should be removed.  Wrap patch in a tissue and discard in the trash. Wash hands thoroughly with soap and water. °2. You may remove the patch earlier than 72 hours if you experience unpleasant side effects which may include dry mouth, dizziness or visual disturbances. °3. Avoid touching the patch. Wash your hands with soap and water after contact with the patch. °   ° ° ° ° °JP Drain Totals °· Bring this sheet to all of your post-operative appointments while you have your  drains. °· Please measure your drains by CC's or ML's. °· Make sure you drain and measure your JP Drains 2 or 3 times per day. °· At the end of each day, add up totals for the left side and add up totals for the right side. °   ( 9 am )     ( 3 pm )        ( 9 pm )                °Date L  R  L  R  L  R  Total L/R  °               °               °               °               °               °               °               °               °               °               °               °               ° ° °

## 2017-12-09 ENCOUNTER — Encounter (HOSPITAL_BASED_OUTPATIENT_CLINIC_OR_DEPARTMENT_OTHER): Payer: Self-pay | Admitting: General Surgery

## 2017-12-11 ENCOUNTER — Other Ambulatory Visit: Payer: Self-pay | Admitting: Oncology

## 2017-12-11 ENCOUNTER — Telehealth: Payer: Self-pay | Admitting: Oncology

## 2017-12-11 NOTE — Telephone Encounter (Signed)
R/s appt per 10/2 sch message - pt is aware of appt date and time   

## 2017-12-18 ENCOUNTER — Encounter: Payer: Self-pay | Admitting: Adult Health

## 2017-12-18 NOTE — Progress Notes (Signed)
[name]  [phone] [phone]     ID: [name] DOB: 1946/02/14  MR#: [id number]  EPP#:[id number]  Patient Care Team: Carol Ada, MD as PCP - General (Family Medicine) Jovita Kussmaul, MD as Consulting Physician (General Surgery) Magrinat, Virgie Dad, MD as Consulting Physician (Oncology) Gery Pray, MD as Consulting Physician (Radiation Oncology) Monna Fam, MD as Consulting Physician (Ophthalmology) Troy Sine, MD as Consulting Physician (Cardiology) Lavonna Monarch, MD as Consulting Physician (Dermatology) Gatha Mayer, MD as Consulting Physician (Gastroenterology) OTHER MD:  CHIEF COMPLAINT: Estrogen receptor positive ductal carcinoma in situ  CURRENT TREATMENT: Tamoxifen   HISTORY OF CURRENT ILLNESS: From the original intake note:  [name] had routine screening mammography on 10/15/2017 showing a possible abnormality in the left breast. She underwent unilateral left diagnostic mammography with tomography at [facility] on 10/18/2017 showing: breast density category C. There is a new 3.0 cm group of grouped calcifications in the left breast that are suspicious for malignancy.   Accordingly on 10/24/2017 she proceeded to biopsy of the left breast area in question. The pathology from this procedure showed ([id number]): Ductal carcinoma in situ, intermediate to high grade. Prognostic indicators significant for: estrogen receptor, 95% positive and progesterone receptor, 95% positive, both with strong staining intensity.  The patient's subsequent history is as detailed below.  INTERVAL HISTORY: [name] returns today for follow up and treatment of her estrogen receptor positive breast cancer.  Since her last visit, she underwent left simple mastectomy with sentinel lymph node sampling on 12/06/2017 with pathology showing ([id number]): Invasive lobular carcinoma, grade II spanning 0.9 cm and involving the central portion  of the left breast. Ductal carcinoma in situ, high grade with calcifications. Lobular carcinoma in situ, intermediate grade. The invasive and in situ lobular carcinoma is more then 2 cm from all margins. One left breast lymph node negative for carcinoma. 5 left axillary sentinel lymph nodes were negative for carcinoma. (0/5). Repeat prognostic panel was significant for estrogen receptor 70 % positive and progesterone receptor 70% positive both with strong staining intensity. Proliferation marker Ki67: 2%.  HER2 negative with an IHC of (1+).   Her case was represented at the multidisciplinary breast cancer conference 12/18/2017.  At that time it was felt that the patient needed no chemo or radiation but would benefit from antiestrogens.  REVIEW OF SYSTEMS: [name] reports that she had no pain from her mastectomy, other than a little pain from the drainage tube. She took some tylenol the 1st night after surgery. Her drain was removed on 12/18/2017. She is planning to use a prothesis. For exercise, she walks every morning for 30 minutes. She also does light weight lifting and stretching. She has some sensitivity in the left axilla and breast. She denies unusual headaches, visual changes, nausea, vomiting, or dizziness. There has been no unusual cough, phlegm production, or pleurisy. There has been no change in bowel or bladder habits. She denies unexplained fatigue or unexplained weight loss, bleeding, rash, or fever. A detailed review of systems was otherwise stable.    PAST MEDICAL HISTORY: Past Medical History:  Diagnosis Date  . Atypical angina ([place]) 08/17/2005   Exercise stress test-EF74% ,normal perfusion  . Cancer ([place]) 10/2017   left breast cancer  . Dysrhythmia 02/09/2009   history of SVT ; Echo normal systolic and diastolic function ,UK=>02%  . Hx of colonic polyps adenomas and ssp 10/09/2017  . Mitral valve prolapse    history mild MVP  Heart rate  is well controlled.   PAST SURGICAL  HISTORY: Past Surgical History:  Procedure Laterality Date  . BREAST BIOPSY Right 1995  . COLONOSCOPY    . COLONOSCOPY  11/2017  . MASTECTOMY W/ SENTINEL NODE BIOPSY Left 12/06/2017   Procedure: MASTECTOMY WITH SENTINEL LYMPH NODE BIOPSY;  Surgeon: Jovita Kussmaul, MD;  Location: [facility];  Service: General;  Laterality: Left;  . POLYPECTOMY    . TONSILLECTOMY      FAMILY HISTORY Family History  Problem Relation Age of Onset  . Heart disease Father   . Colon cancer Neg Hx   . Esophageal cancer Neg Hx   . Pancreatic cancer Neg Hx   . Rectal cancer Neg Hx   . Stomach cancer Neg Hx   The patient's father died at 77 due to congestive heart failure. The patient's mother died at age 61 due to "old age ". She also had a tumor of the tear duct diagnosed at age 67. The patient had no brothers, 1 sister. She denies a history of breast or ovarian cancer in the family.  Note that the patient is of [place] but not Ashkenazi Jewish ethnicity  GYNECOLOGIC HISTORY:  No LMP recorded. Patient is postmenopausal. Menarche: 72 years old Age at first live birth: 72 years old She is [place] P2.  Her LMP was at age 32.  She used oral contraception from [id number] with no complications.  She never used HRT.    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 Her husband, [name], is also retired from being a English as a second language teacher. The patient has 2 daughters: [name] who lives in [place] and works as a Scientist, forensic, and [name] who lives in [place], [place]. The patient has no grandchildren.    ADVANCED DIRECTIVES: The patient's husband is her 52.    HEALTH MAINTENANCE: Social History   Tobacco Use  . Smoking status: Never Smoker  . Smokeless tobacco: Never Used  Substance Use Topics  . Alcohol use: Yes    [name]: occas glass of wine  . Drug use: Never     Colonoscopy: 10/03/2017/ Dr. Carlean Purl found 2 adenomas and sessile polyp; repeat 3  years planned  PAP:  Bone density: 08/16/2014 at [facility] showed a T score of -3.0   No Known Allergies  Current Outpatient Medications  Medication Sig Dispense Refill  . aspirin 81 MG tablet Take 81 mg by mouth once a week.     . Calcium-Vitamin D-Vitamin K (VIACTIV PO) Take by mouth.    . cholecalciferol (VITAMIN D) 1000 UNITS tablet Take 1,000 Units by mouth daily.    Marland Kitchen HYDROcodone-acetaminophen (NORCO/VICODIN) 5-325 MG tablet Take 1-2 tablets by mouth every 6 (six) hours as needed for moderate pain or severe pain. 15 tablet 0  . Multiple Vitamin (MULTI-VITAMIN PO) Take by mouth.    . TOPROL XL 25 MG 24 hr tablet Take 1.5 tablets (37.5 mg total) by mouth daily. 135 tablet 3   No current facility-administered medications for this visit.     OBJECTIVE: Middle-aged white woman in no acute distress Vitals:   12/20/17 0945  BP: 94/66  Pulse: 69  Resp: 18  Temp: 98.2 F (36.8 C)  SpO2: 99%     Body mass index is 22.5 kg/m.   Wt Readings from Last 3 Encounters:  12/20/17 139 lb 6.4 oz (63.2 kg)  12/06/17 138 lb 14.2 oz (63 kg)  11/06/17 138 lb 11.2 oz (62.9  kg)      ECOG FS:0 - Asymptomatic  Sclerae unicteric, EOMs intact No cervical or supraclavicular adenopathy Lungs no rales or rhonchi Heart regular rate and rhythm Abd soft, nontender, positive bowel sounds MSK no focal spinal tenderness, no upper extremity lymphedema Neuro: nonfocal, well oriented, appropriate affect Breasts: The right breast is unremarkable.  The left breast is status post mastectomy.  The incision is healing very nicely.  It is flat and uncomplicated.  There is no erythema swelling or unusual tenderness.  Both axillae are benign.   LAB RESULTS:  CMP     Component Value Date/Time   NA 140 11/06/2017 1222   K 4.7 11/06/2017 1222   CL 103 11/06/2017 1222   CO2 29 11/06/2017 1222   GLUCOSE 110 (H) 11/06/2017 1222   BUN 21 11/06/2017 1222   CREATININE 0.91 11/06/2017 1222   CREATININE  0.72 05/10/2014 0849   CALCIUM 9.9 11/06/2017 1222   PROT 7.0 11/06/2017 1222   ALBUMIN 4.0 11/06/2017 1222   AST 19 11/06/2017 1222   ALT 14 11/06/2017 1222   ALKPHOS 110 11/06/2017 1222   BILITOT 2.0 (H) 11/06/2017 1222   GFRNONAA >60 11/06/2017 1222   GFRAA >60 11/06/2017 1222    No results found for: TOTALPROTELP, ALBUMINELP, A1GS, A2GS, BETS, BETA2SER, GAMS, MSPIKE, SPEI  No results found for: KPAFRELGTCHN, LAMBDASER, KAPLAMBRATIO  Lab Results  Component Value Date   WBC 8.1 11/06/2017   NEUTROABS 5.0 11/06/2017   HGB 14.8 11/06/2017   HCT 44.9 11/06/2017   MCV 95.7 11/06/2017   PLT 165 11/06/2017    _0 @  No results found for: LABCA2  No components found for: BWLSLH734  No results for input(s): INR in the last 168 hours.  No results found for: LABCA2  No results found for: KAJ681  No results found for: LXB262  No results found for: MBT597  No results found for: CA2729  No components found for: HGQUANT  No results found for: CEA1 / No results found for: CEA1   No results found for: AFPTUMOR  No results found for: CHROMOGRNA  No results found for: PSA1  No visits with results within 3 Day(s) from this visit.  Latest known visit with results is:  Appointment on 11/06/2017  Component Date Value Ref Range Status  . WBC Count 11/06/2017 8.1  3.9 - 10.3 K/uL Final  . RBC 11/06/2017 4.69  3.70 - 5.45 MIL/uL Final  . Hemoglobin 11/06/2017 14.8  11.6 - 15.9 g/dL Final  . HCT 11/06/2017 44.9  34.8 - 46.6 % Final  . MCV 11/06/2017 95.7  79.5 - 101.0 fL Final  . MCH 11/06/2017 31.5  25.1 - 34.0 pg Final  . MCHC 11/06/2017 32.9  31.5 - 36.0 g/dL Final  . RDW 11/06/2017 13.2  11.2 - 14.5 % Final  . Platelet Count 11/06/2017 165  145 - 400 K/uL Final  . Neutrophils Relative % 11/06/2017 62  % Final  . Neutro Abs 11/06/2017 5.0  1.5 - 6.5 K/uL Final  . Lymphocytes Relative 11/06/2017 29  % Final  . Lymphs Abs 11/06/2017 2.3  0.9 - 3.3 K/uL Final    . Monocytes Relative 11/06/2017 7  % Final  . Monocytes Absolute 11/06/2017 0.6  0.1 - 0.9 K/uL Final  . Eosinophils Relative 11/06/2017 1  % Final  . Eosinophils Absolute 11/06/2017 0.1  0.0 - 0.5 K/uL Final  . Basophils Relative 11/06/2017 1  % Final  . Basophils Absolute 11/06/2017 0.1  0.0 -  0.1 K/uL Final   Performed at [facility], [place] [address], [place], [place] [address]  . Sodium 11/06/2017 140  135 - 145 mmol/L Final  . Potassium 11/06/2017 4.7  3.5 - 5.1 mmol/L Final  . Chloride 11/06/2017 103  98 - 111 mmol/L Final  . CO2 11/06/2017 29  22 - 32 mmol/L Final  . Glucose, Bld 11/06/2017 110* 70 - 99 mg/dL Final  . BUN 11/06/2017 21  8 - 23 mg/dL Final  . Creatinine 11/06/2017 0.91  0.44 - 1.00 mg/dL Final  . Calcium 11/06/2017 9.9  8.9 - 10.3 mg/dL Final  . Total Protein 11/06/2017 7.0  6.5 - 8.1 g/dL Final  . Albumin 11/06/2017 4.0  3.5 - 5.0 g/dL Final  . AST 11/06/2017 19  15 - 41 U/L Final  . ALT 11/06/2017 14  0 - 44 U/L Final  . Alkaline Phosphatase 11/06/2017 110  38 - 126 U/L Final  . Total Bilirubin 11/06/2017 2.0* 0.3 - 1.2 mg/dL Final  . GFR, Est Non Af Am 11/06/2017 >60  >60 mL/min Final  . GFR, Est AFR Am 11/06/2017 >60  >60 mL/min Final   [name]: (NOTE) The eGFR has been calculated using the CKD EPI equation. This calculation has not been validated in all clinical situations. eGFR's persistently <60 mL/min signify possible Chronic Kidney Disease.   Georgiann Hahn gap 11/06/2017 8  5 - 15 Final   Performed at [facility], [place] [address], [place], [place] [address]    (this displays the last labs from the last 3 days)  No results found for: TOTALPROTELP, ALBUMINELP, A1GS, A2GS, BETS, BETA2SER, GAMS, MSPIKE, SPEI (this displays SPEP labs)  No results found for: KPAFRELGTCHN, LAMBDASER, KAPLAMBRATIO (kappa/lambda light chains)  No results found for: HGBA, HGBA2QUANT, HGBFQUANT, HGBSQUAN (Hemoglobinopathy  evaluation)   No results found for: LDH  No results found for: IRON, TIBC, IRONPCTSAT (Iron and TIBC)  No results found for: FERRITIN  Urinalysis No results found for: COLORURINE, APPEARANCEUR, LABSPEC, PHURINE, GLUCOSEU, HGBUR, BILIRUBINUR, KETONESUR, PROTEINUR, UROBILINOGEN, NITRITE, LEUKOCYTESUR   STUDIES: Pathology reviewed with the patient Nm Sentinel Node Inj-no Rpt (breast)  Result Date: 12/06/2017 Sulfur colloid was injected by the nuclear medicine technologist for melanoma sentinel node.    ELIGIBLE FOR AVAILABLE RESEARCH PROTOCOL: no  ASSESSMENT: 72 y.o. [place], [place] woman status post left breast biopsy 10/24/2017 for ductal carcinoma in situ, grade 3, estrogen and progesterone receptor positive  (1) status post left mastectomy and sentinel lymph node sampling 12/06/2017 for a pT1b pN0, stage IA invasive lobular carcinoma, grade 2, estrogen and progesterone receptor positive, HER-2 not amplified  (2) no indication for adjuvant radiation  (3) tamoxifen started 12/20/2017  (a) bone density on 08/16/2014 shows osteoporosis   PLAN: Emi did very well with her surgery.  We reviewed the fact that lobular breast cancer is a very hard to detect and that is why it was so difficult initially to figure out what was going on.  She definitely made the right decisions by going with mastectomy.  She already has a prescription for bras and prostheses and will be scheduled with physical therapy by Dr. Marlou Starks  She will not need radiation or chemotherapy adjuvantly.  She is now ready to start antiestrogens.  We discussed the difference between tamoxifen and anastrozole in detail. She understands that anastrozole and the aromatase inhibitors in general work by blocking estrogen production. Accordingly vaginal dryness, decrease in bone density, and of course hot flashes  can result. The aromatase inhibitors can also negatively affect the cholesterol profile, although that is a minor  effect. One out of 5 women on aromatase inhibitors we will feel "old and achy". This arthralgia/myalgia syndrome, which resembles fibromyalgia clinically, does resolve with stopping the medications. Accordingly this is not a reason to not try an aromatase inhibitor but it is a frequent reason to stop it (in other words 20% of women will not be able to tolerate these medications).  Tamoxifen on the other hand does not block estrogen production. It does not "take away a woman's estrogen". It blocks the estrogen receptor in breast cells. Like anastrozole, it can also cause hot flashes. As opposed to anastrozole, tamoxifen has many estrogen-like effects. It is technically an estrogen receptor modulator. This means that in some tissues tamoxifen works like estrogen-- for example it helps strengthen the bones. It tends to improve the cholesterol profile. It can cause thickening of the endometrial lining, and even endometrial polyps or rarely cancer of the uterus.(The risk of uterine cancer due to tamoxifen is one additional cancer per thousand women year). It can cause vaginal wetness or stickiness. It can cause blood clots through this estrogen-like effect--the risk of blood clots with tamoxifen is exactly the same as with birth control pills or hormone replacement.  Neither of these agents causes mood changes or weight gain, despite the popular belief that they can have these side effects. We have data from studies comparing either of these drugs with placebo, and in those cases the control group had the same amount of weight gain and depression as the group that took the drug.  I think she would be a good candidate for tamoxifen, given the fact that she took oral contraceptives for many years with no clotting complications and that she has osteoporosis.  She will catch up with Dr. Valentino Saxon and her gynecologist and perhaps after 1 or 2 years on tamoxifen she may have a vaginal ultrasound just to make sure the  endometrial stripe is not getting unusually thickened.  I have scheduled Ahuva to return to see me mid January.  If she is tolerating tamoxifen well the plan will be to continue that a minimum of 5 years.     Reginald Mangels, Virgie Dad, MD  12/20/17 10:26 AM Medical Oncology and Hematology [facility] [address] [place], [place] [address] Tel. [phone]    Fax. [phone]  Alice Rieger, am acting as scribe for Chauncey Cruel MD.  I, Lurline Del MD, have reviewed the above documentation for accuracy and completeness, and I agree with the above.

## 2017-12-20 ENCOUNTER — Telehealth: Payer: Self-pay | Admitting: Oncology

## 2017-12-20 ENCOUNTER — Inpatient Hospital Stay: Payer: Medicare Other | Attending: Oncology | Admitting: Oncology

## 2017-12-20 VITALS — BP 94/66 | HR 69 | Temp 98.2°F | Resp 18 | Ht 66.0 in | Wt 139.4 lb

## 2017-12-20 DIAGNOSIS — Z170 Estrogen receptor positive status [ER+]: Secondary | ICD-10-CM | POA: Diagnosis not present

## 2017-12-20 DIAGNOSIS — C50812 Malignant neoplasm of overlapping sites of left female breast: Secondary | ICD-10-CM | POA: Insufficient documentation

## 2017-12-20 DIAGNOSIS — F329 Major depressive disorder, single episode, unspecified: Secondary | ICD-10-CM | POA: Diagnosis not present

## 2017-12-20 DIAGNOSIS — Z79810 Long term (current) use of selective estrogen receptor modulators (SERMs): Secondary | ICD-10-CM | POA: Insufficient documentation

## 2017-12-20 MED ORDER — TAMOXIFEN CITRATE 20 MG PO TABS: 20.0000 mg | ORAL_TABLET | Freq: Every day | ORAL | 12 refills | Status: AC

## 2017-12-20 NOTE — Telephone Encounter (Signed)
Gave patient avs and calendar.  Patient preferred an afternoon appt.

## 2017-12-23 ENCOUNTER — Encounter: Payer: Self-pay | Admitting: *Deleted

## 2017-12-23 DIAGNOSIS — Z23 Encounter for immunization: Secondary | ICD-10-CM | POA: Diagnosis not present

## 2017-12-24 ENCOUNTER — Telehealth: Payer: Self-pay | Admitting: Adult Health

## 2017-12-24 ENCOUNTER — Other Ambulatory Visit: Payer: Self-pay

## 2017-12-24 ENCOUNTER — Ambulatory Visit: Payer: Medicare Other | Attending: General Surgery | Admitting: Physical Therapy

## 2017-12-24 DIAGNOSIS — Z483 Aftercare following surgery for neoplasm: Secondary | ICD-10-CM | POA: Insufficient documentation

## 2017-12-24 DIAGNOSIS — R293 Abnormal posture: Secondary | ICD-10-CM | POA: Diagnosis not present

## 2017-12-24 NOTE — Therapy (Signed)
[place], [place], [address] Phone: [phone]   Fax:  [phone]  Physical Therapy Evaluation  Patient Details  Name: [name] MRN: [id number] Date of Birth: Jul 31, 1945 Referring Provider (PT): Dr. Marlou Starks    Encounter Date: 12/24/2017  PT End of Session - 12/24/17 1750    Visit Number  1    Number of Visits  1    PT Start Time  1430    PT Stop Time  1515    PT Time Calculation (min)  45 min    Activity Tolerance  Patient tolerated treatment well    Behavior During Therapy  [facility] for tasks assessed/performed       Past Medical History:  Diagnosis Date  . Atypical angina ([place]) 08/17/2005   Exercise stress test-EF74% ,normal perfusion  . Cancer ([place]) 10/2017   left breast cancer  . Dysrhythmia 02/09/2009   history of SVT ; Echo normal systolic and diastolic function ,ON=>62%  . Hx of colonic polyps adenomas and ssp 10/09/2017  . Mitral valve prolapse    history mild MVP    Past Surgical History:  Procedure Laterality Date  . BREAST BIOPSY Right 1995  . COLONOSCOPY    . COLONOSCOPY  11/2017  . MASTECTOMY W/ SENTINEL NODE BIOPSY Left 12/06/2017   Procedure: MASTECTOMY WITH SENTINEL LYMPH NODE BIOPSY;  Surgeon: Jovita Kussmaul, MD;  Location: [place];  Service: General;  Laterality: Left;  . POLYPECTOMY    . TONSILLECTOMY      There were no vitals filed for this visit.   Subjective Assessment - 12/24/17 1440    Subjective  "I was amazed I really didn't have much pain "     Pertinent History  left mastectomy with sentinel node biopsy on 12/06/2017 0/5 nodes positive drain removed 10/9 Will not have chemo, radiation or reconstruction     Currently in Pain?  No/denies         [facility] PT Assessment - 12/24/17 0001      Assessment   Medical Diagnosis  left breast cancer     Referring Provider (PT)  Dr. Marlou Starks     Onset Date/Surgical Date  12/06/17    Hand Dominance   Left      Precautions   Precautions  None      Restrictions   Weight Bearing Restrictions  No      Balance Screen   Has the patient fallen in the past 6 months  No    Has the patient had a decrease in activity level because of a fear of falling?   No    Is the patient reluctant to leave their home because of a fear of falling?   No      Home Film/video editor residence    Living Arrangements  Spouse/significant other    Available Help at Discharge  Available PRN/intermittently      Prior Function   Level of [place]  Retired    Leisure  exercises regularly, walks 30 minutes do stretching, sit ups and lifts 5#      Cognition   Overall Cognitive Status  Within Functional Limits for tasks assessed      Observation/Other Assessments   Observations  healing mastectomy incision in left chest with small area of fullness at chest near axilla and pec major tendon     Skin Integrity  no open  areas     Quick DASH   2.27      Coordination   Gross Motor Movements are Fluid and Coordinated  Yes      Posture/Postural Control   Posture/Postural Control  Postural limitations    Postural Limitations  Rounded Shoulders;Forward head    Posture Comments  history of scoliosis with visible curve       ROM / Strength   AROM / PROM / Strength  Strength      AROM   Right Shoulder Flexion  160 Degrees    Right Shoulder ABduction  175 Degrees    Right Shoulder External Rotation  90 Degrees    Left Shoulder Flexion  155 Degrees    Left Shoulder ABduction  125 Degrees    Left Shoulder External Rotation  80 Degrees      Strength   Overall Strength Comments  at least 4/5. throughout. scapular movement symmetrical         LYMPHEDEMA/ONCOLOGY QUESTIONNAIRE - 12/24/17 1459      Type   Cancer Type  left breast       Lymphedema Assessments   Lymphedema Assessments  Upper extremities      Right Upper Extremity Lymphedema   15 cm Proximal  to Olecranon Process  27.5 cm    Olecranon Process  23 cm    15 cm Proximal to Ulnar Styloid Process  22 cm    Just Proximal to Ulnar Styloid Process  14.8 cm    Across Hand at [employer]  18.5 cm    At [place] of 2nd Digit  5.8 cm      Left Upper Extremity Lymphedema   15 cm Proximal to Olecranon Process  27.8 cm    Olecranon Process  22.5 cm    15 cm Proximal to Ulnar Styloid Process  21.5 cm    Just Proximal to Ulnar Styloid Process  14.5 cm    Across Hand at [employer]  18.5 cm    At [place] of 2nd Digit  5.7 cm          Quick Dash - 12/24/17 0001    Open a tight or new jar  Mild difficulty    Do heavy household chores (wash walls, wash floors)  No difficulty    Carry a shopping bag or briefcase  No difficulty    Wash your back  No difficulty    Use a knife to cut food  No difficulty    Recreational activities in which you take some force or impact through your arm, shoulder, or hand (golf, hammering, tennis)  No difficulty    During the past week, to what extent has your arm, shoulder or hand problem interfered with your normal social activities with family, friends, neighbors, or groups?  Not at all    During the past week, to what extent has your arm, shoulder or hand problem limited your work or other regular daily activities  Not at all    Arm, shoulder, or hand pain.  None    Tingling (pins and needles) in your arm, shoulder, or hand  None    Difficulty Sleeping  No difficulty    DASH Score  2.27 %        Objective measurements completed on examination: See above findings.      [facility] Adult PT Treatment/Exercise - 12/24/17 0001      Exercises   Exercises  Shoulder;Other Exercises    Other Exercises  educated pt to start with no weights and progress slowly to get back to her normal exercise routine.  She does not want to learn the strength ABC program as she feels she doesn't want to complicate her exercise routine.  She wants to try Yoga when she feels she  is more healed.       Shoulder Exercises: Supine   Other Supine Exercises  supine dowel flexion and abduction gently, wihtin pain tolerance with gentle stretch only. beach pose with pillow under elbow for gentle stretch              PT Education - 12/24/17 1750    Education Details  supine dowel rod exercises, ABC class     Person(s) Educated  Patient    Methods  Explanation;Handout    Comprehension  Verbalized understanding;Returned demonstration          PT Long Term Goals - 12/24/17 1752      PT LONG TERM GOAL #1   Title  Pt will demonstrate exercises for shoulder range of motion     Time  1    Period  Days    Status  Achieved      PT LONG TERM GOAL #2   Title  pt will know how to register for ABC class to learn about lymphedema risk reduciton and     Time  1    Period  Days    Status  Achieved             Plan - 12/24/17 1753    Clinical Impression Statement  Pt is less than 3 weeks post mastectomy and feels that she is doing well with her shoulder range of motion.  She does a regular exercise program and was instructed to start with no weights and progress slowly to her previous level as that is what she wants to do.  She does not feel she needs to return to PT to learn other exercises, but knows that she can come back with a new order at any time if she does not progress as she expects she will.  She will come to ABC class to learn more about lymphedema risk reduction  Do not feel that she needs a prophylactic compression sleeve as she is at low risk for lymphedema.     Clinical Presentation  Stable    Clinical Decision Making  Low    Rehab Potential  Good    Clinical Impairments Affecting Rehab Potential  recent surgery     PT Frequency  One time visit    PT Treatment/Interventions  ADLs/Self Care Home Management;Patient/family education;Therapeutic exercise    PT Next Visit Plan  discharge this episode     Consulted and Agree with Plan of Care  Patient        Patient will benefit from skilled therapeutic intervention in order to improve the following deficits and impairments:  Postural dysfunction, Decreased range of motion, Decreased activity tolerance  Visit Diagnosis: Aftercare following surgery for neoplasm - Plan: PT plan of care cert/re-cert  Abnormal posture - Plan: PT plan of care cert/re-cert     Problem List Patient Active Problem List   Diagnosis Date Noted  . Malignant neoplasm of overlapping sites of left breast in female, estrogen receptor positive ([place]) 10/29/2017  . Hx of colonic polyps adenomas and ssp 10/09/2017  . Mitral valve prolapse 06/12/2014  . Total bilirubin, elevated 06/12/2014  . Chest pain 05/07/2014  . Palpitations 05/07/2014  . Paroxysmal  supraventricular tachycardia ([place]) 07/02/2013  . Mitral regurgitation 07/02/2013   Donato Heinz. Owens Shark PT  [place] Levo 12/24/2017, 6:00 PM  [place], [place], [address] Phone: [phone]   Fax:  [phone]  Name: [name] MRN: [id number] Date of Birth: 27-Jan-1946

## 2017-12-24 NOTE — Telephone Encounter (Signed)
LMVM for patient with date/time of appt added per 10/14 sch msg

## 2017-12-24 NOTE — Patient Instructions (Signed)

## 2018-02-04 ENCOUNTER — Ambulatory Visit: Payer: Medicare Other | Admitting: Oncology

## 2018-02-20 ENCOUNTER — Other Ambulatory Visit: Payer: Self-pay | Admitting: Obstetrics and Gynecology

## 2018-02-20 DIAGNOSIS — Z124 Encounter for screening for malignant neoplasm of cervix: Secondary | ICD-10-CM | POA: Diagnosis not present

## 2018-02-20 DIAGNOSIS — R350 Frequency of micturition: Secondary | ICD-10-CM | POA: Diagnosis not present

## 2018-02-20 DIAGNOSIS — R319 Hematuria, unspecified: Secondary | ICD-10-CM | POA: Diagnosis not present

## 2018-02-20 DIAGNOSIS — Z01419 Encounter for gynecological examination (general) (routine) without abnormal findings: Secondary | ICD-10-CM | POA: Diagnosis not present

## 2018-02-20 DIAGNOSIS — N8111 Cystocele, midline: Secondary | ICD-10-CM | POA: Diagnosis not present

## 2018-03-13 DIAGNOSIS — R319 Hematuria, unspecified: Secondary | ICD-10-CM | POA: Diagnosis not present

## 2018-03-13 DIAGNOSIS — N390 Urinary tract infection, site not specified: Secondary | ICD-10-CM | POA: Diagnosis not present

## 2018-03-18 ENCOUNTER — Ambulatory Visit: Payer: Medicare Other | Admitting: Oncology

## 2018-03-24 NOTE — Progress Notes (Signed)
[place]  [phone] Fax:([phone]     ID: [name] DOB: 1945/09/10  MR#: [id number]  CSN#:[id number]  Patient Care Team: Carol Ada, MD as PCP - General (Family Medicine) Jovita Kussmaul, MD as Consulting Physician (General Surgery) Amon Costilla, Virgie Dad, MD as Consulting Physician (Oncology) Gery Pray, MD as Consulting Physician (Radiation Oncology) Monna Fam, MD as Consulting Physician (Ophthalmology) Troy Sine, MD as Consulting Physician (Cardiology) Lavonna Monarch, MD as Consulting Physician (Dermatology) Gatha Mayer, MD as Consulting Physician (Gastroenterology) Aloha Gell, MD as Consulting Physician (Obstetrics and Gynecology) OTHER MD:   CHIEF COMPLAINT: Estrogen receptor positive ductal carcinoma in situ  CURRENT TREATMENT: Tamoxifen   HISTORY OF CURRENT ILLNESS: From the original intake note:  [name] had routine screening mammography on 10/15/2017 showing a possible abnormality in the left breast. She underwent unilateral left diagnostic mammography with tomography at [facility] on 10/18/2017 showing: breast density category C. There is a new 3.0 cm group of grouped calcifications in the left breast that are suspicious for malignancy.   Accordingly on 10/24/2017 she proceeded to biopsy of the left breast area in question. The pathology from this procedure showed ([id number]): Ductal carcinoma in situ, intermediate to high grade. Prognostic indicators significant for: estrogen receptor, 95% positive and progesterone receptor, 95% positive, both with strong staining intensity.  The patient's subsequent history is as detailed below.   INTERVAL HISTORY: [name] returns today for follow-up and treatment of her estrogen receptor positive breast cancer.   The patient continues on tamoxifen. She has hot flashes, but they are mostly unchanged from the hot flashes she had before treatment; she states that  they are actually more mild than the hot flashes she had during menopause. She also has some vaginal wetness and discharge.   Since her last visit here, she has not undergone any additional studies.    REVIEW OF SYSTEMS: [name]  does Yoga on Tuesdays at [employer], which she really enjoys. She is leaving for [place] on 03/26/2017 and then will go on a cruise. She volunteers with [facility] [employer]. She stayed around for the holidays and cooked for her family. She says the back of her arm on her left arm is a little numb. The patient denies unusual headaches, visual changes, nausea, vomiting, or dizziness. There has been no unusual cough, phlegm production, or pleurisy. This been no change in bowel or bladder habits. The patient denies unexplained fatigue or unexplained weight loss, bleeding, rash, or fever. A detailed review of systems was otherwise noncontributory.    PAST MEDICAL HISTORY: Past Medical History:  Diagnosis Date  . Atypical angina ([place]) 08/17/2005   Exercise stress test-EF74% ,normal perfusion  . Cancer ([place]) 10/2017   left breast cancer  . Dysrhythmia 02/09/2009   history of SVT ; Echo normal systolic and diastolic function ,IO=>97%  . Hx of colonic polyps adenomas and ssp 10/09/2017  . Mitral valve prolapse    history mild MVP  Heart rate is well controlled.   PAST SURGICAL HISTORY: Past Surgical History:  Procedure Laterality Date  . BREAST BIOPSY Right 1995  . COLONOSCOPY    . COLONOSCOPY  11/2017  . MASTECTOMY W/ SENTINEL NODE BIOPSY Left 12/06/2017   Procedure: MASTECTOMY WITH SENTINEL LYMPH NODE BIOPSY;  Surgeon: Jovita Kussmaul, MD;  Location: [place];  Service: General;  Laterality: Left;  . POLYPECTOMY    . TONSILLECTOMY      FAMILY HISTORY Family History  Problem Relation Age of Onset  . Heart disease Father   . Colon cancer Neg Hx   . Esophageal cancer Neg Hx   . Pancreatic cancer Neg Hx   . Rectal cancer Neg Hx   . Stomach  cancer Neg Hx   The patient's father died at 18 due to congestive heart failure. The patient's mother died at age 17 due to "old age ". She also had a tumor of the tear duct diagnosed at age 1. The patient had no brothers, 1 sister. She denies a history of breast or ovarian cancer in the family.  Note that the patient is of [place] but not Ashkenazi Jewish ethnicity  GYNECOLOGIC HISTORY:  No LMP recorded. Patient is postmenopausal. Menarche: 73 years old Age at first live birth: 73 years old She is [place] P2.  Her LMP was at age 71.  She used oral contraception from 3419-6222 with no complications.  She never used HRT.    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 Her husband, [name], is also retired from being a English as a second language teacher. The patient has 2 daughters: [name] who lives in [place] and works as a Scientist, forensic, and [name] who lives in [place], [place]. The patient has no grandchildren.    ADVANCED DIRECTIVES: The patient's husband is her 71.    HEALTH MAINTENANCE: Social History   Tobacco Use  . Smoking status: Never Smoker  . Smokeless tobacco: Never Used  Substance Use Topics  . Alcohol use: Yes    [name]: occas glass of wine  . Drug use: Never     Colonoscopy: 10/03/2017/ Dr. Carlean Purl found 2 adenomas and sessile polyp; repeat 3 years planned  PAP:  Bone density: 08/16/2014 at [facility] showed a T score of -3.0   No Known Allergies  Current Outpatient Medications  Medication Sig Dispense Refill  . aspirin 81 MG tablet Take 81 mg by mouth once a week.     . Calcium-Vitamin D-Vitamin K (VIACTIV PO) Take by mouth.    . cholecalciferol (VITAMIN D) 1000 UNITS tablet Take 1,000 Units by mouth daily.    . Multiple Vitamin (MULTI-VITAMIN PO) Take by mouth.    . TOPROL XL 25 MG 24 hr tablet Take 1.5 tablets (37.5 mg total) by mouth daily. 135 tablet 3   No current facility-administered medications for this visit.      OBJECTIVE: Middle-aged white woman who appears well Vitals:   03/25/18 1508  BP: (!) 87/56  Pulse: 67  Resp: 18  Temp: (!) 97.4 F (36.3 C)  SpO2: 99%     Body mass index is 22.6 kg/m.   Wt Readings from Last 3 Encounters:  03/25/18 140 lb (63.5 kg)  12/20/17 139 lb 6.4 oz (63.2 kg)  12/06/17 138 lb 14.2 oz (63 kg)      ECOG FS:0 - Asymptomatic  Sclerae unicteric, pupils round and equal No cervical or supraclavicular adenopathy Lungs no rales or rhonchi Heart regular rate and rhythm Abd soft, nontender, positive bowel sounds MSK no focal spinal tenderness, no upper extremity lymphedema Neuro: nonfocal, well oriented, appropriate affect Breasts: The right breast is benign.  The left breast is status post mastectomy.  There is no evidence of chest wall recurrence.  Both axillae are benign.   LAB RESULTS:  CMP     Component Value Date/Time   NA 140 11/06/2017 1222   K 4.7 11/06/2017 1222   CL 103 11/06/2017 1222  CO2 29 11/06/2017 1222   GLUCOSE 110 (H) 11/06/2017 1222   BUN 21 11/06/2017 1222   CREATININE 0.91 11/06/2017 1222   CREATININE 0.72 05/10/2014 0849   CALCIUM 9.9 11/06/2017 1222   PROT 7.0 11/06/2017 1222   ALBUMIN 4.0 11/06/2017 1222   AST 19 11/06/2017 1222   ALT 14 11/06/2017 1222   ALKPHOS 110 11/06/2017 1222   BILITOT 2.0 (H) 11/06/2017 1222   GFRNONAA >60 11/06/2017 1222   GFRAA >60 11/06/2017 1222    No results found for: TOTALPROTELP, ALBUMINELP, A1GS, A2GS, BETS, BETA2SER, GAMS, MSPIKE, SPEI  No results found for: KPAFRELGTCHN, LAMBDASER, KAPLAMBRATIO  Lab Results  Component Value Date   WBC 8.1 11/06/2017   NEUTROABS 5.0 11/06/2017   HGB 14.8 11/06/2017   HCT 44.9 11/06/2017   MCV 95.7 11/06/2017   PLT 165 11/06/2017    @LASTCHEMISTRY @  No results found for: LABCA2  No components found for: AGTXMI680  No results for input(s): INR in the last 168 hours.  No results found for: LABCA2  No results found for:  HOZ224  No results found for: MGN003  No results found for: BCW888  No results found for: CA2729  No components found for: HGQUANT  No results found for: CEA1 / No results found for: CEA1   No results found for: AFPTUMOR  No results found for: CHROMOGRNA  No results found for: PSA1  No visits with results within 3 Day(s) from this visit.  Latest known visit with results is:  Appointment on 11/06/2017  Component Date Value Ref Range Status  . WBC Count 11/06/2017 8.1  3.9 - 10.3 K/uL Final  . RBC 11/06/2017 4.69  3.70 - 5.45 MIL/uL Final  . Hemoglobin 11/06/2017 14.8  11.6 - 15.9 g/dL Final  . HCT 11/06/2017 44.9  34.8 - 46.6 % Final  . MCV 11/06/2017 95.7  79.5 - 101.0 fL Final  . MCH 11/06/2017 31.5  25.1 - 34.0 pg Final  . MCHC 11/06/2017 32.9  31.5 - 36.0 g/dL Final  . RDW 11/06/2017 13.2  11.2 - 14.5 % Final  . Platelet Count 11/06/2017 165  145 - 400 K/uL Final  . Neutrophils Relative % 11/06/2017 62  % Final  . Neutro Abs 11/06/2017 5.0  1.5 - 6.5 K/uL Final  . Lymphocytes Relative 11/06/2017 29  % Final  . Lymphs Abs 11/06/2017 2.3  0.9 - 3.3 K/uL Final  . Monocytes Relative 11/06/2017 7  % Final  . Monocytes Absolute 11/06/2017 0.6  0.1 - 0.9 K/uL Final  . Eosinophils Relative 11/06/2017 1  % Final  . Eosinophils Absolute 11/06/2017 0.1  0.0 - 0.5 K/uL Final  . Basophils Relative 11/06/2017 1  % Final  . Basophils Absolute 11/06/2017 0.1  0.0 - 0.1 K/uL Final   Performed at [facility], [place] [address], [place], [place] [address]  . Sodium 11/06/2017 140  135 - 145 mmol/L Final  . Potassium 11/06/2017 4.7  3.5 - 5.1 mmol/L Final  . Chloride 11/06/2017 103  98 - 111 mmol/L Final  . CO2 11/06/2017 29  22 - 32 mmol/L Final  . Glucose, Bld 11/06/2017 110* 70 - 99 mg/dL Final  . BUN 11/06/2017 21  8 - 23 mg/dL Final  . Creatinine 11/06/2017 0.91  0.44 - 1.00 mg/dL Final  . Calcium 11/06/2017 9.9  8.9 - 10.3 mg/dL Final  . Total Protein  11/06/2017 7.0  6.5 - 8.1 g/dL Final  . Albumin 11/06/2017 4.0  3.5 -  5.0 g/dL Final  . AST 11/06/2017 19  15 - 41 U/L Final  . ALT 11/06/2017 14  0 - 44 U/L Final  . Alkaline Phosphatase 11/06/2017 110  38 - 126 U/L Final  . Total Bilirubin 11/06/2017 2.0* 0.3 - 1.2 mg/dL Final  . GFR, Est Non Af Am 11/06/2017 >60  >60 mL/min Final  . GFR, Est AFR Am 11/06/2017 >60  >60 mL/min Final   [name]: (NOTE) The eGFR has been calculated using the CKD EPI equation. This calculation has not been validated in all clinical situations. eGFR's persistently <60 mL/min signify possible Chronic Kidney Disease.   Georgiann Hahn gap 11/06/2017 8  5 - 15 Final   Performed at [facility], [place] [address], [place], [place] [address]    (this displays the last labs from the last 3 days)  No results found for: TOTALPROTELP, ALBUMINELP, A1GS, A2GS, BETS, BETA2SER, GAMS, MSPIKE, SPEI (this displays SPEP labs)  No results found for: KPAFRELGTCHN, LAMBDASER, KAPLAMBRATIO (kappa/lambda light chains)  No results found for: HGBA, HGBA2QUANT, HGBFQUANT, HGBSQUAN (Hemoglobinopathy evaluation)   No results found for: LDH  No results found for: IRON, TIBC, IRONPCTSAT (Iron and TIBC)  No results found for: FERRITIN  Urinalysis No results found for: COLORURINE, APPEARANCEUR, LABSPEC, PHURINE, GLUCOSEU, HGBUR, BILIRUBINUR, KETONESUR, PROTEINUR, UROBILINOGEN, NITRITE, LEUKOCYTESUR   STUDIES: She will be due for right screening mammography August 2020  ELIGIBLE FOR AVAILABLE RESEARCH PROTOCOL: no  ASSESSMENT: 73 y.o. [place], [place] woman status post left breast biopsy 10/24/2017 for ductal carcinoma in situ, grade 3, estrogen and progesterone receptor positive  (1) status post left mastectomy and sentinel lymph node sampling 12/06/2017 for a pT1b pN0, stage IA invasive lobular carcinoma, grade 2, estrogen and progesterone receptor positive, HER-2 not amplified  (2) no indication for  adjuvant radiation  (3) tamoxifen started 12/20/2017  (a) bone density on 08/16/2014 shows osteoporosis   PLAN: [place] has recovered completely from her surgery and is tolerating tamoxifen well.  The plan will be to continue that for a total of 5 years.  We discussed vaginal dryness issues.  Right now she has a mild discharge which she finds helpful.  She is not interested in Estring or similar agents and she is not interested in the pelvic floor exercises available through our physical therapy program  She will see Dr. Marlou Starks sometime in March.  She will have mammography in August and she will see me in September.  From that point I will start seeing her on a once a year basis  I commended her excellent exercise program  She will call with any issues that may develop before the next visit.    Sherrilynn Gudgel, Virgie Dad, MD  03/25/18 3:15 PM Medical Oncology and Hematology [facility] [address] [place], [place] [address] Tel. [phone]    Fax. [phone]  I, Jacqualyn Posey am acting as a Education administrator for Chauncey Cruel, MD.   I, Lurline Del MD, have reviewed the above documentation for accuracy and completeness, and I agree with the above.

## 2018-03-25 ENCOUNTER — Telehealth: Payer: Self-pay | Admitting: Oncology

## 2018-03-25 ENCOUNTER — Inpatient Hospital Stay: Payer: Medicare Other | Attending: Oncology | Admitting: Oncology

## 2018-03-25 VITALS — BP 87/56 | HR 67 | Temp 97.4°F | Resp 18 | Ht 66.0 in | Wt 140.0 lb

## 2018-03-25 DIAGNOSIS — Z7982 Long term (current) use of aspirin: Secondary | ICD-10-CM | POA: Insufficient documentation

## 2018-03-25 DIAGNOSIS — Z170 Estrogen receptor positive status [ER+]: Secondary | ICD-10-CM | POA: Diagnosis not present

## 2018-03-25 DIAGNOSIS — C50812 Malignant neoplasm of overlapping sites of left female breast: Secondary | ICD-10-CM | POA: Diagnosis not present

## 2018-03-25 DIAGNOSIS — Z79810 Long term (current) use of selective estrogen receptor modulators (SERMs): Secondary | ICD-10-CM | POA: Diagnosis not present

## 2018-03-25 DIAGNOSIS — M818 Other osteoporosis without current pathological fracture: Secondary | ICD-10-CM | POA: Diagnosis not present

## 2018-03-25 NOTE — Telephone Encounter (Signed)
Gave avs and calendar ° °

## 2018-04-16 DIAGNOSIS — D1801 Hemangioma of skin and subcutaneous tissue: Secondary | ICD-10-CM | POA: Diagnosis not present

## 2018-04-16 DIAGNOSIS — D229 Melanocytic nevi, unspecified: Secondary | ICD-10-CM | POA: Diagnosis not present

## 2018-06-04 ENCOUNTER — Telehealth: Payer: Self-pay | Admitting: Adult Health

## 2018-06-04 NOTE — Telephone Encounter (Signed)
Called pt per voicemail msg. Spoke with pt, wants to reschedule April appt due to virus. Rescheduled and confirmed time and date with pt

## 2018-06-09 ENCOUNTER — Other Ambulatory Visit: Payer: Self-pay | Admitting: Cardiovascular Disease

## 2018-06-09 MED ORDER — TOPROL XL 25 MG PO TB24: 37.5000 mg | ORAL_TABLET | Freq: Every day | ORAL | 1 refills | Status: DC

## 2018-06-09 NOTE — Telephone Encounter (Signed)
Pt's medication was sent to pt's pharmacy as requested. Confirmation received.  °

## 2018-06-30 ENCOUNTER — Encounter: Payer: Medicare Other | Admitting: Adult Health

## 2018-10-06 ENCOUNTER — Telehealth: Payer: Self-pay | Admitting: Adult Health

## 2018-10-06 NOTE — Telephone Encounter (Signed)
I talk with patient regarding video visit °

## 2018-10-28 ENCOUNTER — Inpatient Hospital Stay: Payer: Medicare Other | Attending: Adult Health | Admitting: Adult Health

## 2018-10-28 ENCOUNTER — Encounter: Payer: Self-pay | Admitting: Adult Health

## 2018-10-28 DIAGNOSIS — Z170 Estrogen receptor positive status [ER+]: Secondary | ICD-10-CM

## 2018-10-28 DIAGNOSIS — C50812 Malignant neoplasm of overlapping sites of left female breast: Secondary | ICD-10-CM

## 2018-10-28 DIAGNOSIS — Z79810 Long term (current) use of selective estrogen receptor modulators (SERMs): Secondary | ICD-10-CM | POA: Diagnosis not present

## 2018-10-28 DIAGNOSIS — Z79899 Other long term (current) drug therapy: Secondary | ICD-10-CM | POA: Diagnosis not present

## 2018-10-28 DIAGNOSIS — Z9012 Acquired absence of left breast and nipple: Secondary | ICD-10-CM

## 2018-10-28 DIAGNOSIS — Z7982 Long term (current) use of aspirin: Secondary | ICD-10-CM

## 2018-10-28 NOTE — Progress Notes (Signed)
SURVIVORSHIP VIRTUAL VISIT:  I connected with [name] on 10/28/18 at  2:00 PM EDT by video and verified that I am speaking with the correct person using two identifiers.  I discussed the limitations, risks, security and privacy concerns of performing an evaluation and management service by telephone and the availability of in person appointments. I also discussed with the patient that there may be a patient responsible charge related to this service. The patient expressed understanding and agreed to proceed.   BRIEF ONCOLOGIC HISTORY:  Oncology History  Malignant neoplasm of overlapping sites of left breast in female, estrogen receptor positive ([place])  12/06/2017 Surgery    status post left mastectomy and sentinel lymph node sampling 12/06/2017 for a pT1b pN0, stage IA invasive lobular carcinoma, grade 2, estrogen and progesterone receptor positive, HER-2 not amplified   12/18/2017 Cancer Staging   Staging form: Breast, AJCC 8th Edition - Pathologic: Stage IA (pT1b, pN0, cM0, G2, ER+, PR+, HER2-) - Signed by Gardenia Phlegm, NP on 12/18/2017   12/20/2017 -  Anti-estrogen oral therapy   Tamoxifen daily--bone density on 08/16/2014 shows osteoporosis.       INTERVAL HISTORY:  [name] to review her survivorship care plan detailing her treatment course for breast cancer, as well as monitoring long-term side effects of that treatment, education regarding health maintenance, screening, and overall wellness and health promotion.     Overall, [name] reports feeling quite well.  She is taking Tamoxifen daily and denies any issues with hot flashes, vaginal discharge, or arthralgias.  She is exercising regularly.  She is up to date with her other cancer screenings and has her right breast screening mammogram scheduled for next week at [facility].    REVIEW OF SYSTEMS:  Review of Systems  Constitutional: Negative for appetite change, chills, fatigue, fever and unexpected weight change.  HENT:    Negative for hearing loss, lump/mass, sore throat and trouble swallowing.   Eyes: Negative for eye problems and icterus.  Respiratory: Negative for chest tightness, cough and shortness of breath.   Cardiovascular: Negative for chest pain, leg swelling and palpitations.  Gastrointestinal: Negative for abdominal distention, abdominal pain, constipation, diarrhea, nausea and vomiting.  Endocrine: Negative for hot flashes.  Genitourinary: Negative for difficulty urinating.   Musculoskeletal: Negative for arthralgias.  Skin: Negative for itching and rash.  Neurological: Negative for dizziness, extremity weakness, headaches and numbness.  Hematological: Negative for adenopathy. Does not bruise/bleed easily.  Psychiatric/Behavioral: Negative for depression. The patient is not nervous/anxious.   Breast: Denies any new nodularity, masses, tenderness, nipple changes, or nipple discharge.      ONCOLOGY TREATMENT TEAM:  1. Surgeon:  Dr. Marlou Starks at [facility] 2. Medical Oncologist: Dr. Jana Hakim  3. Radiation Oncologist: Dr. Sondra Come    PAST MEDICAL/SURGICAL HISTORY:  Past Medical History:  Diagnosis Date  . Atypical angina ([place]) 08/17/2005   Exercise stress test-EF74% ,normal perfusion  . Atypical nevus 10/22/2007   minimal - left cheek  . Cancer ([place]) 10/2017   left breast cancer  . Dysrhythmia 02/09/2009   history of SVT ; Echo normal systolic and diastolic function ,DE=>08%  . Hx of colonic polyps adenomas and ssp 10/09/2017  . Mitral valve prolapse    history mild MVP   Past Surgical History:  Procedure Laterality Date  . BREAST BIOPSY Right 1995  . COLONOSCOPY    . COLONOSCOPY  11/2017  . MASTECTOMY W/ SENTINEL NODE BIOPSY Left 12/06/2017   Procedure: MASTECTOMY WITH SENTINEL LYMPH NODE BIOPSY;  Surgeon: Jovita Kussmaul, MD;  Location: [facility];  Service: General;  Laterality: Left;  . POLYPECTOMY    . TONSILLECTOMY       ALLERGIES:  No Known  Allergies   CURRENT MEDICATIONS:  Outpatient Encounter Medications as of 10/28/2018  Medication Sig  . aspirin 81 MG tablet Take 81 mg by mouth once a week.   . Calcium-Vitamin D-Vitamin K (VIACTIV PO) Take by mouth.  . cholecalciferol (VITAMIN D) 1000 UNITS tablet Take 1,000 Units by mouth daily.  . Multiple Vitamin (MULTI-VITAMIN PO) Take by mouth.  . TOPROL XL 25 MG 24 hr tablet Take 1.5 tablets (37.5 mg total) by mouth daily.   No facility-administered encounter medications on file as of 10/28/2018.      ONCOLOGIC FAMILY HISTORY:  Family History  Problem Relation Age of Onset  . Heart disease Father   . Colon cancer Neg Hx   . Esophageal cancer Neg Hx   . Pancreatic cancer Neg Hx   . Rectal cancer Neg Hx   . Stomach cancer Neg Hx      GENETIC COUNSELING/TESTING: Not at this time  SOCIAL HISTORY:  Social History   Socioeconomic History  . Marital status: Married    Spouse name: Not on file  . Number of children: 2  . Years of education: Not on file  . Highest education level: Not on file  Occupational History  . Not on file  Social Needs  . Financial resource strain: Not on file  . Food insecurity    Worry: Not on file    Inability: Not on file  . Transportation needs    Medical: Not on file    Non-medical: Not on file  Tobacco Use  . Smoking status: Never Smoker  . Smokeless tobacco: Never Used  Substance and Sexual Activity  . Alcohol use: Yes    Comment: occas glass of wine  . Drug use: Never  . Sexual activity: Not on file  Lifestyle  . Physical activity    Days per week: Not on file    Minutes per session: Not on file  . Stress: Not on file  Relationships  . Social Herbalist on phone: Not on file    Gets together: Not on file    Attends religious service: Not on file    Active member of club or organization: Not on file    Attends meetings of clubs or organizations: Not on file    Relationship status: Not on file  . Intimate  partner violence    Fear of current or ex partner: Not on file    Emotionally abused: Not on file    Physically abused: Not on file    Forced sexual activity: Not on file  Other Topics Concern  . Not on file  Social History Narrative  . Not on file     OBSERVATIONS/OBJECTIVE:  Patient appears well.  She is in no apparent distress.  Mood and behavior are normal.  Breathing is non labored.  Skin visualized is intact.    LABORATORY DATA:  None for this visit.  DIAGNOSTIC IMAGING:  None for this visit.      ASSESSMENT AND PLAN:  Ms.. [name] is a pleasant 73 y.o. female with Stage IA left breast invasive lobular carcinoma, ER+/PR+/HER2-, diagnosed in 11/2017, treated with mastectomy and anti-estrogen therapy with Tamoxifen beginning in 12/2017.  She presents to the Survivorship Clinic for our initial meeting and routine follow-up  post-completion of treatment for breast cancer.    1. Stage IA left breast cancer:  [name] is continuing to recover from definitive treatment for breast cancer. She will follow-up with her medical oncologist, Dr. Jana Hakim in 11/2018 with history and physical exam per surveillance protocol.  She will continue her anti-estrogen therapy with Tamoxifen. Thus far, she is tolerating the Tamoxifen well, with minimal side effects. Her mammogram is due 10/2018.    Today, a comprehensive survivorship care plan and treatment summary was reviewed with the patient today detailing her breast cancer diagnosis, treatment course, potential late/long-term effects of treatment, appropriate follow-up care with recommendations for the future, and patient education resources.  A copy of this summary, along with a letter will be sent to the patient's primary care provider via mail/fax/In Basket message after today's visit.    2. Bone health:  Given [name] age/history of breast cancer, she is at risk for bone demineralization.  Her last DEXA scan was in 2016 and was consistent with  osteoporosis.  I counseled her that Tamoxifen has a protective effect on the bones.  In the meantime, she was encouraged to increase her consumption of foods rich in calcium, as well as increase her weight-bearing activities.  She was given education on specific activities to promote bone health.  3. Cancer screening:  Due to [name] history and her age, she should receive screening for skin cancers, colon cancer, and gynecologic cancers.  The information and recommendations are listed on the patient's comprehensive care plan/treatment summary and were reviewed in detail with the patient.    4. Health maintenance and wellness promotion: [name] was encouraged to consume 5-7 servings of fruits and vegetables per day. We reviewed the "Nutrition Rainbow" handout, as well as the handout "Take Control of Your Health and Reduce Your Cancer Risk" from the [employer].  She was also encouraged to engage in moderate to vigorous exercise for 30 minutes per day most days of the week. We discussed the [employer] fitness program, which is designed for cancer survivors to help them become more physically fit after cancer treatments.  She was instructed to limit her alcohol consumption and continue to abstain from tobacco use.     5. Support services/counseling: It is not uncommon for this period of the patient's cancer care trajectory to be one of many emotions and stressors.  We discussed how this can be increasingly difficult during the times of quarantine and social distancing due to the COVID-19 pandemic.   She was given information regarding our available services and encouraged to contact me with any questions or for help enrolling in any of our support group/programs.    Follow up instructions:    -Return to cancer center 11/2018 for f/u with Dr. Jana Hakim  -Mammogram due in 10/2018 -Follow up with surgery per Dr. Marlou Starks -She is welcome to return back to the Survivorship Clinic at any time; no  additional follow-up needed at this time.  -Consider referral back to survivorship as a long-term survivor for continued surveillance  The patient was provided an opportunity to ask questions and all were answered. The patient agreed with the plan and demonstrated an understanding of the instructions.   The patient was advised to call back or seek an in-person evaluation if the symptoms worsen or if the condition fails to improve as anticipated.   I provided 20 minutes of face-to-face video visit time during this encounter, and > 50% was spent counseling as documented  under my assessment & plan.  Scot Dock, NP

## 2018-11-06 DIAGNOSIS — Z9012 Acquired absence of left breast and nipple: Secondary | ICD-10-CM | POA: Diagnosis not present

## 2018-11-06 DIAGNOSIS — Z853 Personal history of malignant neoplasm of breast: Secondary | ICD-10-CM | POA: Diagnosis not present

## 2018-11-06 DIAGNOSIS — M810 Age-related osteoporosis without current pathological fracture: Secondary | ICD-10-CM | POA: Diagnosis not present

## 2018-11-20 ENCOUNTER — Other Ambulatory Visit: Payer: Self-pay | Admitting: Cardiovascular Disease

## 2018-11-21 ENCOUNTER — Encounter

## 2018-11-24 ENCOUNTER — Other Ambulatory Visit: Payer: Self-pay

## 2018-11-24 MED ORDER — TOPROL XL 25 MG PO TB24: 37.5000 mg | ORAL_TABLET | Freq: Every day | ORAL | 1 refills | Status: DC

## 2018-11-25 DIAGNOSIS — Z23 Encounter for immunization: Secondary | ICD-10-CM | POA: Diagnosis not present

## 2018-11-26 ENCOUNTER — Other Ambulatory Visit: Payer: Self-pay | Admitting: *Deleted

## 2018-11-26 DIAGNOSIS — Z170 Estrogen receptor positive status [ER+]: Secondary | ICD-10-CM

## 2018-11-26 DIAGNOSIS — C50812 Malignant neoplasm of overlapping sites of left female breast: Secondary | ICD-10-CM
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
## 2018-11-27 ENCOUNTER — Inpatient Hospital Stay (HOSPITAL_BASED_OUTPATIENT_CLINIC_OR_DEPARTMENT_OTHER): Payer: Medicare Other | Admitting: Oncology

## 2018-11-27 ENCOUNTER — Inpatient Hospital Stay: Payer: Medicare Other | Attending: Adult Health

## 2018-11-27 ENCOUNTER — Other Ambulatory Visit: Payer: Self-pay

## 2018-11-27 VITALS — BP 105/64 | HR 70 | Temp 98.3°F | Resp 18 | Ht 66.0 in | Wt 139.5 lb

## 2018-11-27 DIAGNOSIS — C50812 Malignant neoplasm of overlapping sites of left female breast: Secondary | ICD-10-CM | POA: Insufficient documentation

## 2018-11-27 DIAGNOSIS — M818 Other osteoporosis without current pathological fracture: Secondary | ICD-10-CM | POA: Diagnosis not present

## 2018-11-27 DIAGNOSIS — Z170 Estrogen receptor positive status [ER+]: Secondary | ICD-10-CM | POA: Insufficient documentation

## 2018-11-27 DIAGNOSIS — Z8719 Personal history of other diseases of the digestive system: Secondary | ICD-10-CM | POA: Diagnosis not present

## 2018-11-27 DIAGNOSIS — Z79899 Other long term (current) drug therapy: Secondary | ICD-10-CM | POA: Insufficient documentation

## 2018-11-27 DIAGNOSIS — M810 Age-related osteoporosis without current pathological fracture: Secondary | ICD-10-CM | POA: Insufficient documentation

## 2018-11-27 DIAGNOSIS — Z9012 Acquired absence of left breast and nipple: Secondary | ICD-10-CM | POA: Diagnosis not present

## 2018-11-27 DIAGNOSIS — Z8249 Family history of ischemic heart disease and other diseases of the circulatory system: Secondary | ICD-10-CM | POA: Insufficient documentation

## 2018-11-27 DIAGNOSIS — Z79810 Long term (current) use of selective estrogen receptor modulators (SERMs): Secondary | ICD-10-CM | POA: Diagnosis not present

## 2018-11-27 LAB — CBC WITH DIFFERENTIAL (CANCER CENTER ONLY)
Abs Immature Granulocytes: 0.02 10*3/uL (ref 0.00–0.07)
Basophils Absolute: 0.1 10*3/uL (ref 0.0–0.1)
Basophils Relative: 1 %
Eosinophils Absolute: 0.1 10*3/uL (ref 0.0–0.5)
Eosinophils Relative: 1 %
HCT: 42.9 % (ref 36.0–46.0)
Hemoglobin: 14.1 g/dL (ref 12.0–15.0)
Immature Granulocytes: 0 %
Lymphocytes Relative: 32 %
Lymphs Abs: 2.6 10*3/uL (ref 0.7–4.0)
MCH: 31.5 pg (ref 26.0–34.0)
MCHC: 32.9 g/dL (ref 30.0–36.0)
MCV: 96 fL (ref 80.0–100.0)
Monocytes Absolute: 0.6 10*3/uL (ref 0.1–1.0)
Monocytes Relative: 8 %
Neutro Abs: 4.5 10*3/uL (ref 1.7–7.7)
Neutrophils Relative %: 58 %
Platelet Count: 168 10*3/uL (ref 150–400)
RBC: 4.47 MIL/uL (ref 3.87–5.11)
RDW: 12.2 % (ref 11.5–15.5)
WBC Count: 7.9 10*3/uL (ref 4.0–10.5)
nRBC: 0 % (ref 0.0–0.2)

## 2018-11-27 LAB — CMP (CANCER CENTER ONLY)
ALT: 14 U/L (ref 0–44)
AST: 20 U/L (ref 15–41)
Albumin: 3.8 g/dL (ref 3.5–5.0)
Alkaline Phosphatase: 72 U/L (ref 38–126)
Anion gap: 6 (ref 5–15)
BUN: 18 mg/dL (ref 8–23)
CO2: 30 mmol/L (ref 22–32)
Calcium: 9.2 mg/dL (ref 8.9–10.3)
Chloride: 105 mmol/L (ref 98–111)
Creatinine: 0.81 mg/dL (ref 0.44–1.00)
GFR, Est AFR Am: >60 mL/min (ref >60)
GFR, Estimated: >60 mL/min (ref >60)
Glucose, Bld: 82 mg/dL (ref 70–99)
Potassium: 4.2 mmol/L (ref 3.5–5.1)
Sodium: 141 mmol/L (ref 135–145)
Total Bilirubin: 1.3 mg/dL — ABNORMAL HIGH (ref 0.3–1.2)
Total Protein: 6.7 g/dL (ref 6.5–8.1)

## 2018-11-28 ENCOUNTER — Telehealth: Payer: Self-pay | Admitting: Oncology

## 2018-11-28 NOTE — Telephone Encounter (Signed)
I talk with patient regarding schedule  

## 2018-12-03 ENCOUNTER — Encounter: Payer: Self-pay | Admitting: Oncology

## 2018-12-23 ENCOUNTER — Encounter: Payer: Self-pay | Admitting: Oncology

## 2019-01-02 ENCOUNTER — Encounter: Payer: Self-pay | Admitting: Cardiovascular Disease

## 2019-01-02 ENCOUNTER — Other Ambulatory Visit: Payer: Self-pay

## 2019-01-02 ENCOUNTER — Ambulatory Visit (INDEPENDENT_AMBULATORY_CARE_PROVIDER_SITE_OTHER): Payer: Medicare Other | Admitting: Cardiovascular Disease

## 2019-01-02 VITALS — BP 120/74 | HR 61 | Ht 66.0 in | Wt 139.8 lb

## 2019-01-02 DIAGNOSIS — I341 Nonrheumatic mitral (valve) prolapse: Secondary | ICD-10-CM | POA: Diagnosis not present

## 2019-01-02 DIAGNOSIS — Z170 Estrogen receptor positive status [ER+]: Secondary | ICD-10-CM

## 2019-01-02 DIAGNOSIS — I519 Heart disease, unspecified: Secondary | ICD-10-CM | POA: Diagnosis not present

## 2019-01-02 DIAGNOSIS — I5189 Other ill-defined heart diseases: Secondary | ICD-10-CM

## 2019-01-02 DIAGNOSIS — C50812 Malignant neoplasm of overlapping sites of left female breast: Secondary | ICD-10-CM

## 2019-01-02 DIAGNOSIS — I471 Supraventricular tachycardia: Secondary | ICD-10-CM

## 2019-01-02 MED ORDER — TOPROL XL 25 MG PO TB24: 37.5000 mg | ORAL_TABLET | Freq: Every day | ORAL | 3 refills | Status: DC

## 2019-01-02 NOTE — Patient Instructions (Signed)
Follow-Up: IN 12 months Please call our office 2 months in advance, AUG 2021 to schedule this OCT 2021 appointment. In Person You may see Shelva Majestic, MD or one of the following Advanced Practice Providers on your designated Care Team:  Rosaria Ferries, PA-C Jory Sims, DNP, ANP Cadence Kathlen Mody, NP.    Medication Instructions:  The current medical regimen is effective;  continue present plan and medications as directed. Please refer to the Current Medication list given to you today. If you need a refill on your cardiac medications before your next appointment, please call your pharmacy.  At [facility], you and your health needs are our priority.  As part of our continuing mission to provide you with exceptional heart care, we have created designated Provider Care Teams.  These Care Teams include your primary Cardiologist (physician) and Advanced Practice Providers (APPs -  Physician Assistants and Nurse Practitioners) who all work together to provide you with the care you need, when you need it.  Thank you for choosing [facility] at [facility]!!

## 2019-01-02 NOTE — Progress Notes (Signed)
Patient ID: [name], female   DOB: Jul 26, 1945, 73 y.o.   MRN: [id number]      Primary M.D.: Dr. Carol Ada  HPI: [name] is a 73 y.o. female who presents for a 15 month follow-up cardiology evaluation.  [name] has a history of SVT and has been treated with beta blocker therapy with Toprol-XL 37.5 mg daily.  An echo Doppler study in 2010 showed normal systolic function.  She had mild mitral regurgitation.  She has remained fairly active.  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    When I her in February 2016 and at that time she was complaining of difficulty with waking up at night. She sleeps on her back and cannot sleep on her side since she notices palpitations.  She does snore. Remotely she had a sleep study. She also has noticed some development of left-sided chest discomfort.  She denies significant shortness of breath.  She denies presyncope or syncope.  She was very concerned about these palpitations and some episodes of chest discomfort.  She  underwent an echo Doppler study on 05/27/2014 which showed an ejection fraction at 60-65%.  There was grade 1 diastolic dysfunction.  She had documented late systolic bileaflet mitral valve prolapse with trivial mitral regurgitation.  There was mild tricuspid regurgitation.  Estimated PA pressures were normal at 22 mm.  She underwent a nuclear perfusion study which was normal.  She had excellent exercise capacity and exercise for 10 minutes in the Bruce protocol without chest pain or ECG changes.  Scintigraphic images were normal.   I recommended that he change the way she was taking her metoprolol succinate and recommended that she take 25 mg in the morning and 12.5 mg at night.  She feels that this has dramatically improved her symptomatic palpitations nocturnally.  She denies daytime fatigue.    I  saw her in May 2018 at which time she was doing well and denied any chest pain or palpitations.  Her palpitations which  were occurring nocturnally significantly improved with the addition of a 12.5 mg Toprol dose at night in addition to her 25 mg in the morning.   I last saw her in July 2019 at which time she was doing well and was only experiencing very short-lived palpitations which resolved spontaneously.  She did not have any chest pain and denied any change in exercise tolerance.   Since I last saw her, she was diagnosed with breast cancer and is being followed by Dr. Jana Hakim.  She was found to have estrogen receptor positive ductal carcinoma in situ and is status post left mastectomy.  She currently is being treated with tamoxifen.  She tolerated her surgery well.  She continues to walk 1 to 1-1/2 miles per day without chest pain.  She is unaware of recent palpitations.  She presents for follow-up cardiology evaluation.  Past Medical History:  Diagnosis Date  . Atypical angina ([place]) 08/17/2005   Exercise stress test-EF74% ,normal perfusion  . Atypical nevus 10/22/2007   minimal - left cheek  . Cancer ([place]) 10/2017   left breast cancer  . Dysrhythmia 02/09/2009   history of SVT ; Echo normal systolic and diastolic function ,ZJ=>69%  . Hx of colonic polyps adenomas and ssp 10/09/2017  . Mitral valve prolapse    history mild MVP    Past Surgical History:  Procedure Laterality Date  . BREAST BIOPSY Right 1995  . COLONOSCOPY    . COLONOSCOPY  11/2017  . MASTECTOMY W/ SENTINEL NODE BIOPSY Left 12/06/2017   Procedure: MASTECTOMY WITH SENTINEL LYMPH NODE BIOPSY;  Surgeon: Jovita Kussmaul, MD;  Location: [facility];  Service: General;  Laterality: Left;  . POLYPECTOMY    . TONSILLECTOMY      No Known Allergies  Current Outpatient Medications  Medication Sig Dispense Refill  . Calcium-Vitamin D-Vitamin K (VIACTIV PO) Take by mouth.    . cholecalciferol (VITAMIN D) 1000 UNITS tablet Take 1,000 Units by mouth daily.    . Multiple Vitamin (MULTI-VITAMIN PO) Take by mouth.    . tamoxifen  (NOLVADEX) 20 MG tablet Take 1 tablet by mouth daily.    . TOPROL XL 25 MG 24 hr tablet Take 1.5 tablets (37.5 mg total) by mouth daily. 135 tablet 3   No current facility-administered medications for this visit.     Social History   Socioeconomic History  . Marital status: Married    Spouse name: Not on file  . Number of children: 2  . Years of education: Not on file  . Highest education level: Not on file  Occupational History  . Not on file  Social Needs  . Financial resource strain: Not on file  . Food insecurity    Worry: Not on file    Inability: Not on file  . Transportation needs    Medical: Not on file    Non-medical: Not on file  Tobacco Use  . Smoking status: Never Smoker  . Smokeless tobacco: Never Used  Substance and Sexual Activity  . Alcohol use: Yes    Comment: occas glass of wine  . Drug use: Never  . Sexual activity: Not on file  Lifestyle  . Physical activity    Days per week: Not on file    Minutes per session: Not on file  . Stress: Not on file  Relationships  . Social Herbalist on phone: Not on file    Gets together: Not on file    Attends religious service: Not on file    Active member of club or organization: Not on file    Attends meetings of clubs or organizations: Not on file    Relationship status: Not on file  . Intimate partner violence    Fear of current or ex partner: Not on file    Emotionally abused: Not on file    Physically abused: Not on file    Forced sexual activity: Not on file  Other Topics Concern  . Not on file  Social History Narrative  . 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  She is married, has 2 children.  She does exercise fairly regularly.  She drinks occasional alcohol.  No tobacco use.  Family History  Problem Relation Age of Onset  . Heart disease Father   . Colon cancer Neg Hx   . Esophageal cancer Neg Hx   . Pancreatic cancer Neg Hx   . Rectal cancer  Neg Hx   . Stomach cancer Neg Hx     ROS General: Negative; No fevers, chills, or night sweats;  HEENT:  Positive with the complaint that her "nose runs all the time"  No changes in vision or hearing, difficulty swallowing Pulmonary: Negative; No cough, wheezing, shortness of breath, hemoptysis Cardiovascular:   See history of present illness GI: Negative; No nausea, vomiting, diarrhea, or abdominal pain GU: Negative; No dysuria, hematuria, or difficulty voiding Musculoskeletal:  Negative; no myalgias, joint pain, or weakness Hematologic/Oncology: Positive for ductal carcinoma in situ, intermediate to high-grade, estrogen receptor and progesterone receptor positive Endocrine: Negative; no heat/cold intolerance; no diabetes Neuro: Negative; no changes in balance, headaches Skin: Negative; No rashes or skin lesions Psychiatric: Negative; No behavioral problems, depression Sleep: Negative; No snoring, daytime sleepiness, hypersomnolence, bruxism, restless legs, hypnogognic hallucinations, no cataplexy Other comprehensive 14 point system review is negative.   PE:  BP 120/74   Pulse 61   Ht _0  (1.676 m)   Wt 139 lb 12.8 oz (63.4 kg)   SpO2 99%   BMI 22.56 kg/m    Repeat blood pressure by me 126/72  Wt Readings from Last 3 Encounters:  01/02/19 139 lb 12.8 oz (63.4 kg)  11/27/18 139 lb 8 oz (63.3 kg)  03/25/18 140 lb (63.5 kg)   General: Alert, oriented, no distress.  Skin: normal turgor, no rashes, warm and dry HEENT: Normocephalic, atraumatic. Pupils equal round and reactive to light; sclera anicteric; extraocular muscles intact;  Nose without nasal septal hypertrophy Mouth/Parynx benign; Mallinpatti scale 2 Neck: No JVD, no carotid bruits; normal carotid upstroke Lungs: clear to ausculatation and percussion; no wheezing or rales Chest wall: without tenderness to palpitation; status post left mastectomy Heart: PMI not displaced, RRR, s1 s2 normal, 1/6 systolic murmur, no  diastolic murmur, systolic click, no rubs, gallops, thrills, or heaves Abdomen: soft, nontender; no hepatosplenomehaly, BS+; abdominal aorta nontender and not dilated by palpation. Back: no CVA tenderness Pulses 2+ Musculoskeletal: full range of motion, normal strength, no joint deformities Extremities: no clubbing cyanosis or edema, Homan's sign negative  Neurologic: grossly nonfocal; Cranial nerves grossly wnl Psychologic: Normal mood and affect   ECG (independently read by me): NSR at 61; normal intervals;no ectopy  July 2019 ECG (independently read by me): Normal sinus rhythm with one isolated PVC.  Heart rate 69 bpm.  Normal intervals.  May 2018 ECG (independently read by me): Normal sinus rhythm 68 bpm.  UTC.  Interval 423 ms.  PR interval 160 ms.  April 2017 ECG (independently read by me): Normal sinus rhythm at 65.  No ectopy.  Normal intervals.  QTC 436 ms.  05/05/2014 ECG (independently read by me):  Normal sinus rhythm at 75 bpm. No significant ST segment changes.  Prior ECG (independently read by me): Sinus rhythm at 59 beats per minute.  Nondiagnostic T changes V1, V2.  Normal intervals.  LABS:  BMP Latest Ref Rng & Units 11/27/2018 11/06/2017 05/10/2014  Glucose 70 - 99 mg/dL 82 110(H) 81  BUN 8 - 23 mg/dL _1 Creatinine 0.44 - 1.00 mg/dL 0.81 0.91 0.72  Sodium 135 - 145 mmol/L 141 140 138  Potassium 3.5 - 5.1 mmol/L 4.2 4.7 4.6  Chloride 98 - 111 mmol/L 105 103 102  CO2 22 - 32 mmol/L _2 Calcium 8.9 - 10.3 mg/dL 9.2 9.9 9.8    Hepatic Function Latest Ref Rng & Units 11/27/2018 11/06/2017 05/10/2014  Total Protein 6.5 - 8.1 g/dL 6.7 7.0 6.7  Albumin 3.5 - 5.0 g/dL 3.8 4.0 4.2  AST 15 - 41 U/L _3 ALT 0 - 44 U/L _4 Alk Phosphatase 38 - 126 U/L 72 110 103  Total Bilirubin 0.3 - 1.2 mg/dL 1.3(H) 2.0(H) 2.2(H)    CBC Latest Ref Rng & Units 11/27/2018 11/06/2017 05/10/2014  WBC 4.0 - 10.5 K/uL 7.9 8.1 6.7  Hemoglobin 12.0 - 15.0 g/dL 14.1 14.8  15.2(H)  Hematocrit 36.0 - 46.0 % 42.9 44.9 46.9(H)  Platelets 150 - 400 K/uL 168 165 203   Lab Results  Component Value Date   MCV 96.0 11/27/2018   MCV 95.7 11/06/2017   MCV 95.5 05/10/2014   Lab Results  Component Value Date   TSH 1.626 05/10/2014  No results found for: HGBA1C  Lipid Panel     Component Value Date/Time   CHOL 189 05/10/2014 0849   TRIG 57 05/10/2014 0849   HDL 75 05/10/2014 0849   CHOLHDL 2.5 05/10/2014 0849   VLDL 11 05/10/2014 0849   LDLCALC 103 (H) 05/10/2014 0849    IMPRESSION:  1. Mitral valve prolapse   2. H/O SVT (supraventricular tachycardia) ([place])   3. Malignant neoplasm of overlapping sites of left breast in female, estrogen receptor positive ([place])   4. Grade I diastolic dysfunction      ASSESSMENT AND PLAN: [name] is a 73 year-old female with previously documented supraventricular tachycardia which has been treated successfully with beta-blocker therapy.  Her echo Doppler study has confirmed bileaflet late systolic mitral valve prolapse.  She has multiple systolic clicks on exam due to this.  She has documented normal systolic function with mild Grade I  diastolic relaxation abnormality.  A prior nuclear perfusion study has demonstrated good exercise tolerance with normal perfusion.  Cardiovascularly, she is doing well.  Her blood pressure today is controlled and she is currently taking Toprol-XL 37.5 mg total with 25 mg in the morning and 12.5 mg at night.  Having a split this way has reduced her early morning palpitations which essentially have resolved.  She does not have any anginal symptomatology.  I reviewed her recent oncologic history and she is status post left mastectomy for ductal carcinoma in situ with ductal calcifications that were noted leading ultimately to her left mastectomy.  She tolerated the surgery well without cardiovascular compromise.  She sees Dr. Carol Ada for primary care.  I reviewed most recent laboratory.   Cardiac wise she is stable and I will see her in 1 year for reevaluation.  Troy Sine, MD, [facility]  01/04/2019 11:04 AM

## 2019-01-04 ENCOUNTER — Encounter: Payer: Self-pay | Admitting: Cardiovascular Disease

## 2019-01-05 ENCOUNTER — Other Ambulatory Visit: Payer: Self-pay | Admitting: Oncology

## 2019-06-22 ENCOUNTER — Other Ambulatory Visit: Payer: Self-pay | Admitting: *Deleted

## 2019-06-22 DIAGNOSIS — Z170 Estrogen receptor positive status [ER+]: Secondary | ICD-10-CM

## 2019-06-22 DIAGNOSIS — C50812 Malignant neoplasm of overlapping sites of left female breast: Secondary | ICD-10-CM

## 2019-06-25 DIAGNOSIS — N6320 Unspecified lump in the left breast, unspecified quadrant: Secondary | ICD-10-CM | POA: Diagnosis not present

## 2019-11-08 ENCOUNTER — Other Ambulatory Visit: Payer: Self-pay | Admitting: Cardiovascular Disease

## 2019-11-09 DIAGNOSIS — Z1231 Encounter for screening mammogram for malignant neoplasm of breast: Secondary | ICD-10-CM | POA: Diagnosis not present

## 2019-11-26 DIAGNOSIS — Z23 Encounter for immunization: Secondary | ICD-10-CM | POA: Diagnosis not present

## 2019-11-29 NOTE — Progress Notes (Signed)
[place]  [phone] Fax:([phone]     ID: [name] DOB: 05-02-1945  MR#: [id number]  YBW#:[id number]  Patient Care Team: Carol Ada, MD as PCP - General (Family Medicine) Jovita Kussmaul, MD as Consulting Physician (General Surgery) Magrinat, Virgie Dad, MD as Consulting Physician (Oncology) Gery Pray, MD as Consulting Physician (Radiation Oncology) Monna Fam, MD as Consulting Physician (Ophthalmology) Troy Sine, MD as Consulting Physician (Cardiology) Lavonna Monarch, MD as Consulting Physician (Dermatology) Gatha Mayer, MD as Consulting Physician (Gastroenterology) Aloha Gell, MD as Consulting Physician (Obstetrics and Gynecology) OTHER MD:   CHIEF COMPLAINT: Estrogen receptor positive ductal carcinoma in situ (s/p left mastectomy)  CURRENT TREATMENT: Tamoxifen   INTERVAL HISTORY: [name] returns today for follow-up and treatment of her estrogen receptor positive breast cancer.   She continues on tamoxifen.  She has minimal to no side effects from this although she has noted her hair is a little bit more "brittle".  Since her last visit, she presented with a new palpable abnormality medially, just lateral to the sternum. She underwent left breast ultrasound at [facility] on 06/25/2019 showing: palpable area of concern corresponds to a 4-5 cm horizontal hard bony area, corresponding to area of sternal-costral cartilage; no solid suspicious mass noted.  On 11/09/2019 she underwent right screening mammography showing breast density category C, no evidence of malignancy no evidence of malignancy   REVIEW OF SYSTEMS: [name] has had both doses of the Pfizer vaccine and tolerated them well.  She will have the booster next month.  She walks about a mile most mornings also does tai chi yoga and some tension exercises in the evening.  Her repeat bone density was actually improved.  She and her husband are doing well although  her husband sister is having some medical issues which are of concern.  A detailed review of systems today was otherwise stable   HISTORY OF CURRENT ILLNESS: From the original intake note:  [name] had routine screening mammography on 10/15/2017 showing a possible abnormality in the left breast. She underwent unilateral left diagnostic mammography with tomography at [facility] on 10/18/2017 showing: breast density category C. There is a new 3.0 cm group of grouped calcifications in the left breast that are suspicious for malignancy.   Accordingly on 10/24/2017 she proceeded to biopsy of the left breast area in question. The pathology from this procedure showed ([id number]): Ductal carcinoma in situ, intermediate to high grade. Prognostic indicators significant for: estrogen receptor, 95% positive and progesterone receptor, 95% positive, both with strong staining intensity.  The patient's subsequent history is as detailed below.   PAST MEDICAL HISTORY: Past Medical History:  Diagnosis Date  . Atypical angina ([place]) 08/17/2005   Exercise stress test-EF74% ,normal perfusion  . Atypical nevus 10/22/2007   minimal - left cheek  . Cancer ([place]) 10/2017   left breast cancer  . Dysrhythmia 02/09/2009   history of SVT ; Echo normal systolic and diastolic function ,OM=>35%  . Hx of colonic polyps adenomas and ssp 10/09/2017  . Mitral valve prolapse    history mild MVP  Heart rate is well controlled.    PAST SURGICAL HISTORY: Past Surgical History:  Procedure Laterality Date  . BREAST BIOPSY Right 1995  . COLONOSCOPY    . COLONOSCOPY  11/2017  . MASTECTOMY W/ SENTINEL NODE BIOPSY Left 12/06/2017   Procedure: MASTECTOMY WITH SENTINEL LYMPH NODE BIOPSY;  Surgeon: Jovita Kussmaul, MD;  Location: [facility];  Service: General;  Laterality: Left;  . POLYPECTOMY    . TONSILLECTOMY      FAMILY HISTORY Family History  Problem Relation Age of Onset  . Heart disease Father   .  Colon cancer Neg Hx   . Esophageal cancer Neg Hx   . Pancreatic cancer Neg Hx   . Rectal cancer Neg Hx   . Stomach cancer Neg Hx   The patient's father died at 93 due to congestive heart failure. The patient's mother died at age 68 due to "old age ". She also had a tumor of the tear duct diagnosed at age 44. The patient had no brothers, 1 sister. She denies a history of breast or ovarian cancer in the family.  Note that the patient is of [place] but not Ashkenazi Jewish ethnicity   GYNECOLOGIC HISTORY:  No LMP recorded. Patient is postmenopausal. Menarche: 74 years old Age at first live birth: 74 years old She is [place] P2.  Her LMP was at age 23.  She used oral contraception from 2353-6144 with no complications.  She never used HRT.    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 Her husband, [name], is also retired from being a English as a second language teacher. The patient has 2 daughters: [name] who lives in [place] and works as a Scientist, forensic, and [name] who lives in [place], [place]. The patient has no grandchildren.    ADVANCED DIRECTIVES: The patient's husband is her 21.    HEALTH MAINTENANCE: Social History   Tobacco Use  . Smoking status: Never Smoker  . Smokeless tobacco: Never Used  Vaping Use  . Vaping Use: Never used  Substance Use Topics  . Alcohol use: Yes    [name]: occas glass of wine  . Drug use: Never     Colonoscopy: 10/03/2017/ Dr. Carlean Purl found 2 adenomas and sessile polyp; repeat 3 years planned  PAP:  Bone density: 08/16/2014 at [facility] showed a T score of -3.0   No Known Allergies  Current Outpatient Medications  Medication Sig Dispense Refill  . aspirin EC 81 MG tablet Take 1 tablet (81 mg total) by mouth 2 (two) times a week. Swallow whole. 30 tablet 11  . Calcium-Vitamin D-Vitamin K (VIACTIV PO) Take by mouth.    . cholecalciferol (VITAMIN D) 1000 UNITS tablet Take 1,000 Units by mouth daily.      . Multiple Vitamin (MULTI-VITAMIN PO) Take by mouth.    . tamoxifen (NOLVADEX) 20 MG tablet Take 1 tablet (20 mg total) by mouth daily. 90 tablet 4  . TOPROL XL 25 MG 24 hr tablet TAKE 1.5 TABLETS BY MOUTH DAILY 135 tablet 1   No current facility-administered medications for this visit.    OBJECTIVE: White woman who appears younger than stated age 56:   11/30/19 1158  BP: 101/70  Pulse: 69  Resp: 18  Temp: (!) 97 F (36.1 C)  SpO2: 98%     Body mass index is 23.39 kg/m.   Wt Readings from Last 3 Encounters:  11/30/19 144 lb 14.4 oz (65.7 kg)  01/02/19 139 lb 12.8 oz (63.4 kg)  11/27/18 139 lb 8 oz (63.3 kg)      ECOG FS:1 - Symptomatic but completely ambulatory  Sclerae unicteric, EOMs intact Wearing a mask No cervical or supraclavicular adenopathy Lungs no rales or rhonchi Heart regular rate and rhythm Abd soft, nontender, positive bowel sounds MSK no focal spinal tenderness, no upper extremity lymphedema Neuro: nonfocal, well oriented, appropriate  affect Breasts: The right breast is benign.  The left breast is status post mastectomy.  There is no evidence of local recurrence.  Both axillae are benign.   LAB RESULTS:  CMP     Component Value Date/Time   NA 141 11/27/2018 1107   K 4.2 11/27/2018 1107   CL 105 11/27/2018 1107   CO2 30 11/27/2018 1107   GLUCOSE 82 11/27/2018 1107   BUN 18 11/27/2018 1107   CREATININE 0.81 11/27/2018 1107   CREATININE 0.72 05/10/2014 0849   CALCIUM 9.2 11/27/2018 1107   PROT 6.7 11/27/2018 1107   ALBUMIN 3.8 11/27/2018 1107   AST 20 11/27/2018 1107   ALT 14 11/27/2018 1107   ALKPHOS 72 11/27/2018 1107   BILITOT 1.3 (H) 11/27/2018 1107   GFRNONAA >60 11/27/2018 1107   GFRAA >60 11/27/2018 1107    No results found for: TOTALPROTELP, ALBUMINELP, A1GS, A2GS, BETS, BETA2SER, GAMS, MSPIKE, SPEI  No results found for: KPAFRELGTCHN, LAMBDASER, KAPLAMBRATIO  Lab Results  Component Value Date   WBC 7.9 11/27/2018    NEUTROABS 4.5 11/27/2018   HGB 14.1 11/27/2018   HCT 42.9 11/27/2018   MCV 96.0 11/27/2018   PLT 168 11/27/2018   No results found for: LABCA2  No components found for: [id number]  No results for input(s): INR in the last 168 hours.  No results found for: LABCA2  No results found for: TJQ300  No results found for: PQZ300  No results found for: TMA263  No results found for: CA2729  No components found for: HGQUANT  No results found for: CEA1 / No results found for: CEA1   No results found for: AFPTUMOR  No results found for: CHROMOGRNA  No results found for: HGBA, HGBA2QUANT, HGBFQUANT, HGBSQUAN (Hemoglobinopathy evaluation)   No results found for: LDH  No results found for: IRON, TIBC, IRONPCTSAT (Iron and TIBC)  No results found for: FERRITIN  Urinalysis No results found for: COLORURINE, APPEARANCEUR, LABSPEC, PHURINE, GLUCOSEU, HGBUR, BILIRUBINUR, KETONESUR, PROTEINUR, UROBILINOGEN, NITRITE, LEUKOCYTESUR   STUDIES: No results found.   ELIGIBLE FOR AVAILABLE RESEARCH PROTOCOL: no  ASSESSMENT: 74 y.o. [place], [place] woman status post left breast biopsy 10/24/2017 for ductal carcinoma in situ, grade 3, estrogen and progesterone receptor positive  (1) status post left mastectomy and sentinel lymph node sampling 12/06/2017 for a pT1b pN0, stage IA invasive lobular carcinoma, grade 2, estrogen and progesterone receptor positive, HER-2 not amplified  (2) no indication for adjuvant radiation  (3) tamoxifen started 12/20/2017  (a) bone density on 08/16/2014 shows osteoporosis with a T score of -3.0.  (b) repeat bone density 11/06/2018 shows a T score of -2.6.   PLAN: Allexis is now 2 years out from definitive surgery for her breast cancer with no evidence of disease recurrence.  This is very favorable.  She is tolerating tamoxifen well and the plan will be to continue that a total of 5 years.  Her bone density has improved, from -3.0 to -2.6.  Likely this is  due to her walking, vitamin D, and tamoxifen.  If this trend continues she will be osteopenic not osteoporotic when she repeats this test in 2 years.  She will have her lab work when she sees her primary care physician in November.  Otherwise she will see me again in 1 year.  She knows to call for any other issue that may develop before then  Total encounter time 25 minutes.*   Magrinat, Virgie Dad, MD  11/30/19 12:34 PM Medical Oncology and Hematology  [facility] [address] [place], [place] [address] Tel. [phone]    Fax. [phone]   I, Wilburn Mylar, am acting as scribe for Dr. Virgie Dad. [name].  I, Lurline Del MD, have reviewed the above documentation for accuracy and completeness, and I agree with the above.   *Total Encounter Time as defined by the Centers for Medicare and Medicaid Services includes, in addition to the face-to-face time of a patient visit (documented in the note above) non-face-to-face time: obtaining and reviewing outside history, ordering and reviewing medications, tests or procedures, care coordination (communications with other health care professionals or caregivers) and documentation in the medical record.

## 2019-11-30 ENCOUNTER — Inpatient Hospital Stay: Payer: Medicare Other | Attending: Oncology | Admitting: Oncology

## 2019-11-30 ENCOUNTER — Other Ambulatory Visit: Payer: Self-pay

## 2019-11-30 ENCOUNTER — Other Ambulatory Visit: Payer: Medicare Other

## 2019-11-30 VITALS — BP 101/70 | HR 69 | Temp 97.0°F | Resp 18 | Ht 66.0 in | Wt 144.9 lb

## 2019-11-30 DIAGNOSIS — C50812 Malignant neoplasm of overlapping sites of left female breast: Secondary | ICD-10-CM

## 2019-11-30 DIAGNOSIS — Z8719 Personal history of other diseases of the digestive system: Secondary | ICD-10-CM | POA: Insufficient documentation

## 2019-11-30 DIAGNOSIS — Z170 Estrogen receptor positive status [ER+]: Secondary | ICD-10-CM | POA: Diagnosis not present

## 2019-11-30 DIAGNOSIS — Z79810 Long term (current) use of selective estrogen receptor modulators (SERMs): Secondary | ICD-10-CM | POA: Diagnosis not present

## 2019-11-30 DIAGNOSIS — D0512 Intraductal carcinoma in situ of left breast: Secondary | ICD-10-CM | POA: Insufficient documentation

## 2019-11-30 MED ORDER — TAMOXIFEN CITRATE 20 MG PO TABS
20.0000 mg | ORAL_TABLET | Freq: Every day | ORAL | 4 refills | Status: DC
Start: 2019-11-30 — End: 2021-02-13

## 2019-12-21 DIAGNOSIS — Z23 Encounter for immunization: Secondary | ICD-10-CM | POA: Diagnosis not present

## 2020-02-02 DIAGNOSIS — C50912 Malignant neoplasm of unspecified site of left female breast: Secondary | ICD-10-CM | POA: Diagnosis not present

## 2020-02-02 DIAGNOSIS — I471 Supraventricular tachycardia: Secondary | ICD-10-CM | POA: Diagnosis not present

## 2020-02-02 DIAGNOSIS — Z0000 Encounter for general adult medical examination without abnormal findings: Secondary | ICD-10-CM | POA: Diagnosis not present

## 2020-02-02 DIAGNOSIS — E7800 Pure hypercholesterolemia, unspecified: Secondary | ICD-10-CM | POA: Diagnosis not present

## 2020-02-02 DIAGNOSIS — M818 Other osteoporosis without current pathological fracture: Secondary | ICD-10-CM | POA: Diagnosis not present

## 2020-02-02 DIAGNOSIS — Z1389 Encounter for screening for other disorder: Secondary | ICD-10-CM | POA: Diagnosis not present

## 2020-02-02 DIAGNOSIS — Z23 Encounter for immunization: Secondary | ICD-10-CM | POA: Diagnosis not present

## 2020-02-24 ENCOUNTER — Other Ambulatory Visit: Payer: Self-pay

## 2020-02-24 ENCOUNTER — Telehealth: Payer: Self-pay | Admitting: Cardiovascular Disease

## 2020-02-24 ENCOUNTER — Encounter: Payer: Self-pay | Admitting: General Practice

## 2020-02-24 ENCOUNTER — Ambulatory Visit (INDEPENDENT_AMBULATORY_CARE_PROVIDER_SITE_OTHER): Payer: Medicare Other | Admitting: General Practice

## 2020-02-24 VITALS — BP 106/66 | HR 69 | Ht 66.0 in | Wt 143.2 lb

## 2020-02-24 DIAGNOSIS — Z170 Estrogen receptor positive status [ER+]: Secondary | ICD-10-CM | POA: Diagnosis not present

## 2020-02-24 DIAGNOSIS — M62838 Other muscle spasm: Secondary | ICD-10-CM

## 2020-02-24 DIAGNOSIS — I341 Nonrheumatic mitral (valve) prolapse: Secondary | ICD-10-CM

## 2020-02-24 DIAGNOSIS — R0789 Other chest pain: Secondary | ICD-10-CM | POA: Diagnosis not present

## 2020-02-24 DIAGNOSIS — C50812 Malignant neoplasm of overlapping sites of left female breast: Secondary | ICD-10-CM | POA: Diagnosis not present

## 2020-02-24 NOTE — Telephone Encounter (Signed)
[name] is calling stating she has been feeling a "twinging" sensation on the left side of her chest this week on two different occurrences. I asked if she would describe is as CP or palpitations and she clarified it was not either of those things, and only knew to describe it as a "twinge". She states she has also had symptoms of nausea as well as left arm tingling. The last occurrence occurred last night, but states she is not having symptoms now. She is requesting she be worked in for an appointment due to this. Please advise.

## 2020-02-24 NOTE — Patient Instructions (Signed)
Medication Instructions:  The current medical regimen is effective;  continue present plan and medications as directed. Please refer to the Current Medication list given to you today.  *If you need a refill on your cardiac medications before your next appointment, please call your pharmacy*  Lab Work:   Testing/Procedures:  NONE    NONE  Special Instructions PLEASE READ AND FOLLOW SALTY 6-ATTACHED-1,800mg  daily  PLEASE MAINTAIN PHYSICAL ACTIVITY AS TOLERATED  Follow-Up: Your next appointment:  KEEP SCHEDULED  In Person with Shelva Majestic, MD   At [facility], you and your health needs are our priority.  As part of our continuing mission to provide you with exceptional heart care, we have created designated Provider Care Teams.  These Care Teams include your primary Cardiologist (physician) and Advanced Practice Providers (APPs -  Physician Assistants and Nurse Practitioners) who all work together to provide you with the care you need, when you need it.            6 SALTY THINGS TO AVOID     1,800MG  DAILY

## 2020-02-24 NOTE — Telephone Encounter (Signed)
Patient reports twinging sensation last night and this morning - left side of chest - states she could feel her heart beating. Left arm tingling this morning when waking up. She reports not being able to sleep well last night - sat up in bed most of night. This morning she felt nauseous/clammy  She has no known CAD per chart review   She requested an appt - scheduled for today 02/24/20 @ 0945 with [name] NP

## 2020-02-24 NOTE — Progress Notes (Signed)
Cardiology Clinic Note   Patient Name: [name] Date of Encounter: 02/24/2020  Primary Care Provider:  Carol Ada, MD Primary Cardiologist:  Shelva Majestic, MD  Patient Profile    [name] 74 year old female presents the clinic today for evaluation of her chest pain.  Past Medical History    Past Medical History:  Diagnosis Date  . Atypical angina ([place]) 08/17/2005   Exercise stress test-EF74% ,normal perfusion  . Atypical nevus 10/22/2007   minimal - left cheek  . Cancer ([place]) 10/2017   left breast cancer  . Dysrhythmia 02/09/2009   history of SVT ; Echo normal systolic and diastolic function ,HY=>85%  . Hx of colonic polyps adenomas and ssp 10/09/2017  . Mitral valve prolapse    history mild MVP   Past Surgical History:  Procedure Laterality Date  . BREAST BIOPSY Right 1995  . COLONOSCOPY    . COLONOSCOPY  11/2017  . MASTECTOMY W/ SENTINEL NODE BIOPSY Left 12/06/2017   Procedure: MASTECTOMY WITH SENTINEL LYMPH NODE BIOPSY;  Surgeon: Jovita Kussmaul, MD;  Location: [facility];  Service: General;  Laterality: Left;  . POLYPECTOMY    . TONSILLECTOMY      Allergies  No Known Allergies  History of Present Illness    Ms. Mian is a PMH of paroxysmal supraventricular tachycardia on metoprolol, mitral valve regurgitation, mitral valve prolapse, and palpitations. Her echocardiogram 2010 showed normal systolic function. She was noted to have mild mitral valve regurgitation.  She was seen by Dr. Claiborne Billings 2/16. At that time she complained of difficulty with waking up at night. She also complained of not being able to sleep on her side due to palpitations. She reported snoring. She denied shortness of breath, syncope and presyncope. She also indicated that she had some episodes of chest discomfort along with her palpitations. A repeat echocardiogram 05/26/2024 showed an ejection fraction of 60-65%, G1 DD, late systolic bileaflet mitral valve prolapse with  trivial mitral valve regurgitation, and mild tricuspid valve regurgitation. She underwent nuclear stress test which was normal. She had excellent exercise capacity and exercised for 10 minutes of Bruce protocol without chest pain or ECG changes. It was recommended that she start taking metoprolol succinate 25 mg in the morning and 12 and half at night. With the addition of the medication her symptoms dramatically improved. She denied daytime fatigue.  She was seen 5/18 and doing well. She denied chest pain or palpitations. She did notice occasional palpitations occurring at night that have improved significantly with the addition of 12.5 mg at nighttime metoprolol.  She was seen 7/19 and was doing well at that time. She did note occasional brief episodes of palpitations that resolved spontaneously. She denied chest pain and changes with her exercise tolerance.  She was last seen by Dr. Claiborne Billings on 01/02/2019. During that time she had been diagnosed with breast cancer and was being followed by oncology. She was found to have estrogen receptor positive ductal carcinoma in situ and was status post left mastectomy. She was being treated with tamoxifen. She had tolerated her surgery well. She had increased her physical activity and was walking 1-1 0.5-1.5 miles daily without chest pain. She denied episodes of palpitations. She was felt to be stable and her follow-up was planned for 1 year.  She contacted nurse triage on 02/24/2020 and indicated that she had been feeling twinging sensations on her left side on 2 separate occasions. She denied chest pain or palpitations. She reported symptoms  of nausea and left arm tingling. She reported that her last occurrence happened last night and not during that time because she is not having any symptoms.  She presents the clinic today for evaluation and states she has had several episodes of chest twinges over the last 2 days.  She reports that she woke up at 3 AM this  morning with episodes of left chest muscle twinges.  She fell back to sleep and woke up at 5 AM to continue muscle twinges.  She woke her husband up and walk to the bathroom.  She reports she moved her bowels and felt sweaty and clammy.  She had her last episode of muscle twinge around 9 AM.  She denies pain with these episodes and reports they last about 1 to 2 seconds before dissipating without intervention.  She indicates that she did an hour-long yoga session yesterday and continues to walk 20-25 minutes daily.  She denies exertional episodes of chest pain.  She needs a heart healthy low-sodium diet.  I will have her follow-up with Dr. Claiborne Billings as scheduled, give her the salty 6 diet sheet and have her maintain her physical activity.  Today she denies chest pain, shortness of breath, lower extremity edema, fatigue, palpitations, melena, hematuria, hemoptysis, diaphoresis, weakness, presyncope, syncope, orthopnea, and PND.   Home Medications    Prior to Admission medications   Medication Sig Start Date End Date Taking? Authorizing Provider  aspirin EC 81 MG tablet Take 1 tablet (81 mg total) by mouth 2 (two) times a week. Swallow whole. 11/30/19   Magrinat, Virgie Dad, MD  Calcium-Vitamin D-Vitamin K (VIACTIV PO) Take by mouth.    [provider]  cholecalciferol (VITAMIN D) 1000 UNITS tablet Take 1,000 Units by mouth daily.    [provider]  Multiple Vitamin (MULTI-VITAMIN PO) Take by mouth.    [provider]  tamoxifen (NOLVADEX) 20 MG tablet Take 1 tablet (20 mg total) by mouth daily. 11/30/19   Magrinat, Virgie Dad, MD  TOPROL XL 25 MG 24 hr tablet TAKE 1.5 TABLETS BY MOUTH DAILY 11/09/19   Troy Sine, MD    Family History    Family History  Problem Relation Age of Onset  . Heart disease Father   . Colon cancer Neg Hx   . Esophageal cancer Neg Hx   . Pancreatic cancer Neg Hx   . Rectal cancer Neg Hx   . Stomach cancer Neg Hx    She indicated that her mother  is deceased. She indicated that her father is deceased. She indicated that her sister is alive. She indicated that the status of her neg hx is unknown.  Social History    Social History   Socioeconomic History  . Marital status: Married    Spouse name: Not on file  . Number of children: 2  . Years of education: Not on file  . Highest education level: Not on file  Occupational History  . Not on file  Tobacco Use  . Smoking status: Never Smoker  . Smokeless tobacco: Never Used  Vaping Use  . Vaping Use: Never used  Substance and Sexual Activity  . Alcohol use: Yes    Comment: occas glass of wine  . Drug use: Never  . Sexual activity: Not on file  Other Topics Concern  . Not on file  Social History Narrative  . Not on file   Social Determinants of Health   Financial Resource Strain: Not on file  Food  Insecurity: Not on file  Transportation Needs: Not on file  Physical Activity: Not on file  Stress: Not on file  Social Connections: Not on file  Intimate Partner Violence: Not on file     Review of Systems    General:  No chills, fever, night sweats or weight changes.  Cardiovascular:  No chest pain, dyspnea on exertion, edema, orthopnea, palpitations, paroxysmal nocturnal dyspnea. Dermatological: No rash, lesions/masses Respiratory: No cough, dyspnea Urologic: No hematuria, dysuria Abdominal:   No nausea, vomiting, diarrhea, bright red blood per rectum, melena, or hematemesis Neurologic:  No visual changes, wkns, changes in mental status. All other systems reviewed and are otherwise negative except as noted above.  Physical Exam    VS:  BP 106/66 (BP Location: Right Arm, Patient Position: Sitting, Cuff Size: Normal)   Pulse 69   Ht 5\' 6"  (1.676 m)   Wt 143 lb 3.2 oz (65 kg)   BMI 23.11 kg/m  , BMI Body mass index is 23.11 kg/m. GEN: Well nourished, well developed, in no acute distress. HEENT: normal. Neck: Supple, no JVD, carotid bruits, or masses. Cardiac:  RRR, no murmurs, rubs, or gallops. No clubbing, cyanosis, edema.  Radials/DP/PT 2+ and equal bilaterally.  Respiratory:  Respirations regular and unlabored, clear to auscultation bilaterally. GI: Soft, nontender, nondistended, BS + x 4. MS: no deformity or atrophy. Skin: warm and dry, no rash. Neuro:  Strength and sensation are intact. Psych: Normal affect.  Accessory Clinical Findings    Recent Labs: No results found for requested labs within last 8760 hours.   Recent Lipid Panel    Component Value Date/Time   CHOL 189 05/10/2014 0849   TRIG 57 05/10/2014 0849   HDL 75 05/10/2014 0849   CHOLHDL 2.5 05/10/2014 0849   VLDL 11 05/10/2014 0849   LDLCALC 103 (H) 05/10/2014 0849    ECG personally reviewed by me today-normal sinus rhythm rightward axis deviation 68 bpm- No acute changes  EKG 01/02/2019 Normal sinus rhythm 61 bpm  Nuclear stress test 05/27/2014 Echo LVEF 60-65%, normal wall motion, diastolic dysfunction, normal LV filling pressure, bileaflet mitral valve thickening with late systolic prolapse, trivial MR, mild TR  Nuc nl  Assessment & Plan   1. Atypical chest pain/muscle spasms -no chest pain today. Had 2 separate incidences last night with sensations described as "twinges" that lasted for seconds and dissipated on their own.  Episodes appear to be left chest wall muscle spasms.  Episode of diaphoresis/clamminess appears to be a vagal response during bowel movement.  Normal nuclear stress test 05/27/2014 Continue aspirin, metoprolol Heart healthy low-sodium diet-salty 6 given Maintain physical activity Patient reassured that this was not related to cardiac issues.  Mitral valve prolapse-no increased DOE or activity intolerance. Echocardiogram 2016 showed bileaflet late systolic mitral valve prolapse, normal LVEF, G1 DD. Continue metoprolol Heart healthy low-sodium diet Maintain physical activity  Malignant neoplasm left breast-underwent left mastectomy.  Estrogen receptor positive. Finished course of tamoxifen. Follows with oncology  Disposition: Follow-up with Dr. Claiborne Billings in as scheduled.  Jossie Ng. Hayzlee Mcsorley NP-C    02/24/2020, 10:03 AM [address] [address] Office [phone] Fax [phone]  Notice: This dictation was prepared with Dragon dictation along with smaller phrase technology. Any transcriptional errors that result from this process are unintentional and may not be corrected upon review.

## 2020-02-24 NOTE — Addendum Note (Signed)
Addended by: Waylan Rocher on: 02/24/2020 10:57 AM   Modules accepted: Orders

## 2020-04-06 ENCOUNTER — Other Ambulatory Visit: Payer: Self-pay

## 2020-04-06 ENCOUNTER — Ambulatory Visit (INDEPENDENT_AMBULATORY_CARE_PROVIDER_SITE_OTHER): Payer: Medicare Other | Admitting: Cardiovascular Disease

## 2020-04-06 ENCOUNTER — Encounter: Payer: Self-pay | Admitting: Cardiovascular Disease

## 2020-04-06 DIAGNOSIS — I471 Supraventricular tachycardia, unspecified: Secondary | ICD-10-CM

## 2020-04-06 DIAGNOSIS — I5189 Other ill-defined heart diseases: Secondary | ICD-10-CM | POA: Diagnosis not present

## 2020-04-06 DIAGNOSIS — Z170 Estrogen receptor positive status [ER+]: Secondary | ICD-10-CM

## 2020-04-06 DIAGNOSIS — C50812 Malignant neoplasm of overlapping sites of left female breast: Secondary | ICD-10-CM

## 2020-04-06 DIAGNOSIS — R0789 Other chest pain: Secondary | ICD-10-CM | POA: Diagnosis not present

## 2020-04-06 DIAGNOSIS — I341 Nonrheumatic mitral (valve) prolapse: Secondary | ICD-10-CM | POA: Diagnosis not present

## 2020-04-06 NOTE — Progress Notes (Signed)
Patient ID: [name], female   DOB: 11/13/1945, 75 y.o.   MRN: [id number]      Primary M.D.: Dr. Carol Ada  HPI: [name] is a 75 y.o. female who presents for a 15 month follow-up cardiology evaluation.  [name] has a history of SVT and has been treated with beta blocker therapy with Toprol-XL 37.5 mg daily.  An echo Doppler study in 2010 showed normal systolic function.  She had mild mitral regurgitation.  She has remained fairly active.  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    When I her in February 2016 and at that time she was complaining of difficulty with waking up at night. She sleeps on her back and cannot sleep on her side since she notices palpitations.  She does snore. Remotely she had a sleep study. She also has noticed some development of left-sided chest discomfort.  She denies significant shortness of breath.  She denies presyncope or syncope.  She was very concerned about these palpitations and some episodes of chest discomfort.  She  underwent an echo Doppler study on 05/27/2014 which showed an ejection fraction at 60-65%.  There was grade 1 diastolic dysfunction.  She had documented late systolic bileaflet mitral valve prolapse with trivial mitral regurgitation.  There was mild tricuspid regurgitation.  Estimated PA pressures were normal at 22 mm.  She underwent a nuclear perfusion study which was normal.  She had excellent exercise capacity and exercise for 10 minutes in the Bruce protocol without chest pain or ECG changes.  Scintigraphic images were normal.   I recommended that he change the way she was taking her metoprolol succinate and recommended that she take 25 mg in the morning and 12.5 mg at night.  She feels that this has dramatically improved her symptomatic palpitations nocturnally.  She denies daytime fatigue.    I  saw her in May 2018 at which time she was doing well and denied any chest pain or palpitations.  Her palpitations which  were occurring nocturnally significantly improved with the addition of a 12.5 mg Toprol dose at night in addition to her 25 mg in the morning.   I  saw her in July 2019 at which time she was doing well and was only experiencing very short-lived palpitations which resolved spontaneously.  She did not have any chest pain and denied any change in exercise tolerance.   She was diagnosed with breast cancer and is being followed by Dr. Jana Hakim.  She was found to have estrogen receptor positive ductal carcinoma in situ and is status post left mastectomy.  She currently is being treated with tamoxifen.  She tolerated her surgery well.    I last saw her in October 2020 at which time she was doing well and was without chest pain.  She was walking 1 to 1-1/2 miles per day without chest pain.  She was unaware of recent palpitations.    She was evaluated by [name], NP in December 2021 when she presented with chest pain.  These were not exertionally precipitated to be nonischemic in etiology.  Presently these symptoms have subsided.  She is now taking Toprol 25 mg in the morning and 12.5 mg at night which is resolved her previous nocturnal palpitations.  She continues to be on aspirin.  She is on tamoxifen 20 mg daily.  Past Medical History:  Diagnosis Date   Atypical angina ([place]) 08/17/2005   Exercise stress test-EF74% ,normal perfusion  Atypical nevus 10/22/2007   minimal - left cheek   Cancer ([place]) 10/2017   left breast cancer   Dysrhythmia 02/09/2009   history of SVT ; Echo normal systolic and diastolic function ,OI=>37%   Hx of colonic polyps adenomas and ssp 10/09/2017   Mitral valve prolapse    history mild MVP    Past Surgical History:  Procedure Laterality Date   BREAST BIOPSY Right 1995   COLONOSCOPY     COLONOSCOPY  11/2017   MASTECTOMY W/ SENTINEL NODE BIOPSY Left 12/06/2017   Procedure: MASTECTOMY WITH SENTINEL LYMPH NODE BIOPSY;  Surgeon: Autumn Messing III, MD;  Location:  [facility];  Service: General;  Laterality: Left;   POLYPECTOMY     TONSILLECTOMY      No Known Allergies  Current Outpatient Medications  Medication Sig Dispense Refill   aspirin EC 81 MG tablet Take 1 tablet (81 mg total) by mouth 2 (two) times a week. Swallow whole. 30 tablet 11   cholecalciferol (VITAMIN D) 1000 UNITS tablet Take 1,000 Units by mouth daily.     Multiple Vitamin (MULTI-VITAMIN PO) Take by mouth.     tamoxifen (NOLVADEX) 20 MG tablet Take 1 tablet (20 mg total) by mouth daily. 90 tablet 4   TOPROL XL 25 MG 24 hr tablet TAKE 1.5 TABLETS BY MOUTH DAILY 135 tablet 1   Calcium-Vitamin D-Vitamin K (VIACTIV PO) Take by mouth.     No current facility-administered medications for this visit.    Social History   Socioeconomic History   Marital status: Married    Spouse name: Not on file   Number of children: 2   Years of education: Not on file   Highest education level: Not on file  Occupational History   Not on file  Tobacco Use   Smoking status: Never Smoker   Smokeless tobacco: Never Used  Vaping Use   Vaping Use: Never used  Substance and Sexual Activity   Alcohol use: Yes    Comment: occas glass of wine   Drug use: Never   Sexual activity: Not on file  Other Topics Concern   Not on file  Social History Narrative   Not on file   Social Determinants of Health   Financial Resource Strain: Not on file  Food Insecurity: Not on file  Transportation Needs: Not on file  Physical Activity: Not on file  Stress: Not on file  Social Connections: Not on file  Intimate Partner Violence: Not on file   Socially, 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  She is married, has 2 children.  She does exercise fairly regularly.  She drinks occasional alcohol.  No tobacco use.  Family History  Problem Relation Age of Onset   Heart disease Father    Colon cancer Neg Hx    Esophageal cancer Neg Hx    Pancreatic  cancer Neg Hx    Rectal cancer Neg Hx    Stomach cancer Neg Hx     ROS General: Negative; No fevers, chills, or night sweats;  HEENT:  Positive with the complaint that her "nose runs all the time"  No changes in vision or hearing, difficulty swallowing Pulmonary: Negative; No cough, wheezing, shortness of breath, hemoptysis Cardiovascular:   See history of present illness GI: Negative; No nausea, vomiting, diarrhea, or abdominal pain GU: Negative; No dysuria, hematuria, or difficulty voiding Musculoskeletal: Negative; no myalgias, joint pain, or weakness Hematologic/Oncology: Positive for ductal carcinoma in situ, intermediate to high-grade,  estrogen receptor and progesterone receptor positive Endocrine: Negative; no heat/cold intolerance; no diabetes Neuro: Negative; no changes in balance, headaches Skin: Negative; No rashes or skin lesions Psychiatric: Negative; No behavioral problems, depression Sleep: Negative; No snoring, daytime sleepiness, hypersomnolence, bruxism, restless legs, hypnogognic hallucinations, no cataplexy Other comprehensive 14 point system review is negative.   PE:  BP 120/76 (BP Location: Right Arm, Patient Position: Sitting)    Pulse 63    Ht 5' 6"  (1.676 m)    Wt 143 lb 12.8 oz (65.2 kg)    SpO2 97%    BMI 23.21 kg/m    Repeat blood pressure by me was 118/78  Wt Readings from Last 3 Encounters:  04/06/20 143 lb 12.8 oz (65.2 kg)  02/24/20 143 lb 3.2 oz (65 kg)  11/30/19 144 lb 14.4 oz (65.7 kg)   General: Alert, oriented, no distress.  Skin: normal turgor, no rashes, warm and dry HEENT: Normocephalic, atraumatic. Pupils equal round and reactive to light; sclera anicteric; extraocular muscles intact;  Nose without nasal septal hypertrophy Mouth/Parynx benign; Mallinpatti scale 2 Neck: No JVD, no carotid bruits; normal carotid upstroke Lungs: clear to ausculatation and percussion; no wheezing or rales Chest wall: without tenderness to  palpitation Heart: PMI not displaced, RRR, s1 s2 normal, 1/6 systolic murmur, no diastolic murmur, systolic click, no rubs, gallops, thrills, or heaves Abdomen: soft, nontender; no hepatosplenomehaly, BS+; abdominal aorta nontender and not dilated by palpation. Back: no CVA tenderness Pulses 2+ Musculoskeletal: full range of motion, normal strength, no joint deformities Extremities: no clubbing cyanosis or edema, Homan's sign negative  Neurologic: grossly nonfocal; Cranial nerves grossly wnl Psychologic: Normal mood and affect   ECG (independently read by me): NSR at 63, possible LAE, no ectopy, normal intervals  October 2020 ECG (independently read by me): NSR at 61; normal intervals;no ectopy  July 2019 ECG (independently read by me): Normal sinus rhythm with one isolated PVC.  Heart rate 69 bpm.  Normal intervals.  May 2018 ECG (independently read by me): Normal sinus rhythm 68 bpm.  UTC.  Interval 423 ms.  PR interval 160 ms.  April 2017 ECG (independently read by me): Normal sinus rhythm at 65.  No ectopy.  Normal intervals.  QTC 436 ms.  05/05/2014 ECG (independently read by me):  Normal sinus rhythm at 75 bpm. No significant ST segment changes.  Prior ECG (independently read by me): Sinus rhythm at 59 beats per minute.  Nondiagnostic T changes V1, V2.  Normal intervals.  LABS:  BMP Latest Ref Rng & Units 11/27/2018 11/06/2017 05/10/2014  Glucose 70 - 99 mg/dL 82 110(H) 81  BUN 8 - 23 mg/dL 18 21 22   Creatinine 0.44 - 1.00 mg/dL 0.81 0.91 0.72  Sodium 135 - 145 mmol/L 141 140 138  Potassium 3.5 - 5.1 mmol/L 4.2 4.7 4.6  Chloride 98 - 111 mmol/L 105 103 102  CO2 22 - 32 mmol/L 30 29 28   Calcium 8.9 - 10.3 mg/dL 9.2 9.9 9.8    Hepatic Function Latest Ref Rng & Units 11/27/2018 11/06/2017 05/10/2014  Total Protein 6.5 - 8.1 g/dL 6.7 7.0 6.7  Albumin 3.5 - 5.0 g/dL 3.8 4.0 4.2  AST 15 - 41 U/L 20 19 18   ALT 0 - 44 U/L 14 14 14   Alk Phosphatase 38 - 126 U/L 72 110 103  Total  Bilirubin 0.3 - 1.2 mg/dL 1.3(H) 2.0(H) 2.2(H)    CBC Latest Ref Rng & Units 11/27/2018 11/06/2017 05/10/2014  WBC 4.0 - 10.5 K/uL  7.9 8.1 6.7  Hemoglobin 12.0 - 15.0 g/dL 14.1 14.8 15.2(H)  Hematocrit 36.0 - 46.0 % 42.9 44.9 46.9(H)  Platelets 150 - 400 K/uL 168 165 203   Lab Results  Component Value Date   MCV 96.0 11/27/2018   MCV 95.7 11/06/2017   MCV 95.5 05/10/2014   Lab Results  Component Value Date   TSH 1.626 05/10/2014  No results found for: HGBA1C  Lipid Panel     Component Value Date/Time   CHOL 189 05/10/2014 0849   TRIG 57 05/10/2014 0849   HDL 75 05/10/2014 0849   CHOLHDL 2.5 05/10/2014 0849   VLDL 11 05/10/2014 0849   LDLCALC 103 (H) 05/10/2014 0849    IMPRESSION:  1. Paroxysmal supraventricular tachycardia ([place])   2. Atypical chest pain   3. Mitral valve prolapse   4. Malignant neoplasm of overlapping sites of left breast in female, estrogen receptor positive ([place])   5. Grade I diastolic dysfunction     ASSESSMENT AND PLAN: Ms. [name] is a 75 year-old female with previously documented supraventricular tachycardia which has been treated successfully with beta-blocker therapy.  Her echo Doppler study has confirmed bileaflet late systolic mitral valve prolapse.  She has multiple systolic clicks on exam due to this.  She has documented normal systolic function with mild Grade I  diastolic relaxation abnormality.  A prior nuclear perfusion study has demonstrated good exercise tolerance with normal perfusion.  She had recently developed atypical chest pain leading to her presentation with [name], NP in December 2021.  Her symptoms were nonexertional and described as "twinges "that lasted for seconds and self resolved.  Presently, she is doing well without recurrence.  Her blood pressure is stable.  She is not having any palpitations on her current Toprol regimen of 25 mg in the morning and 12.5 mg at night.  Her breast cancer is stable and she continues to  be on tamoxifen.  She continues to be active.  I will see her in 1 year for reevaluation or sooner if problems .   Troy Sine, MD, [facility]  04/07/2020 5:04 PM

## 2020-04-06 NOTE — Patient Instructions (Signed)
Medication Instructions:  The current medical regimen is effective;  continue present plan and medications as directed. Please refer to the Current Medication list given to you today.  *If you need a refill on your cardiac medications before your next appointment, please call your pharmacy*  Lab Work:   Testing/Procedures:  NONE    NONE  Follow-Up: Your next appointment:  12 month(s) In Person with You may see Shelva Majestic, MD or one of the following Advanced Practice Providers on your designated Care Team:  Almyra Deforest, PA-C  Fabian Sharp, [place] or Roby Lofts, PA-C  Please call our office 2 months in advance to schedule this appointment   At [facility], you and your health needs are our priority.  As part of our continuing mission to provide you with exceptional heart care, we have created designated Provider Care Teams.  These Care Teams include your primary Cardiologist (physician) and Advanced Practice Providers (APPs -  Physician Assistants and Nurse Practitioners) who all work together to provide you with the care you need, when you need it.

## 2020-04-07 ENCOUNTER — Encounter: Payer: Self-pay | Admitting: Cardiovascular Disease

## 2020-05-19 ENCOUNTER — Other Ambulatory Visit: Payer: Self-pay | Admitting: Cardiovascular Disease

## 2020-06-17 DIAGNOSIS — L237 Allergic contact dermatitis due to plants, except food: Secondary | ICD-10-CM | POA: Diagnosis not present

## 2020-08-03 ENCOUNTER — Encounter: Payer: Self-pay | Admitting: Oncology

## 2020-08-18 IMAGING — MR MR BILATERAL BREAST WITHOUT AND WITH CONTRAST
8 of 12 series · 32 of 48 positions shown · IV contrast (12ml Multihance)
Comparison: Previous exam(s).

CLINICAL DATA: Known DCIS in the left breast. Evaluate for extent
of disease.

LABS:  None
EXAM:
BILATERAL BREAST MRI WITH AND WITHOUT CONTRAST
TECHNIQUE: Multiplanar, multisequence MR images of both breasts were obtained
prior to and following the intravenous administration of 12 ml of
MultiHance.
Three-dimensional MR images were rendered by post-processing the
original MR data using the DynaCAD thin client. The 3D MR images are
interpreted and the findings are included in the complete MRI report
below.

[Series 2: t2_tirm_tra ipat (a-p) · axial · 3.0mm · 0.64mm/px · 1 of 55 slices shown]
[im 1/55]
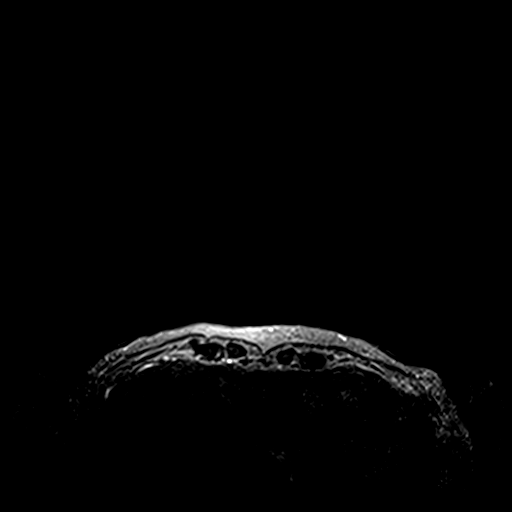

[Series 3: fl3d pre-cm no · axial · non-contrast · 1.2mm · 0.86mm/px · z∈[-51,+120]mm · 5 of 144 slices shown]
[im 1/144]
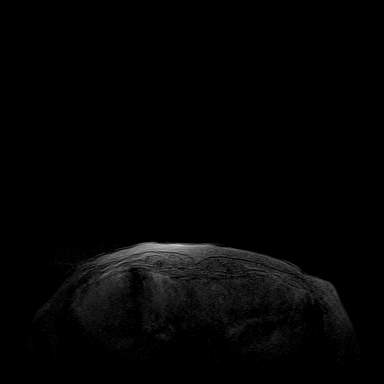
[im 36/144]
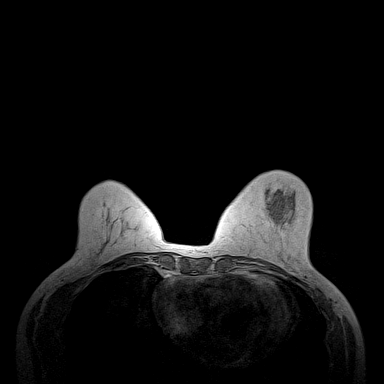
[im 72/144]
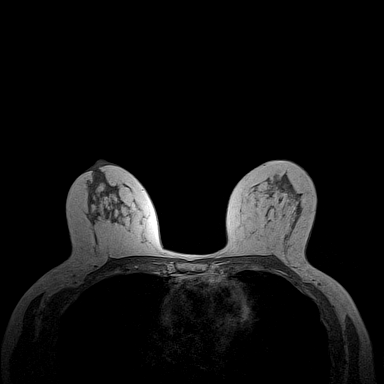
[im 108/144]
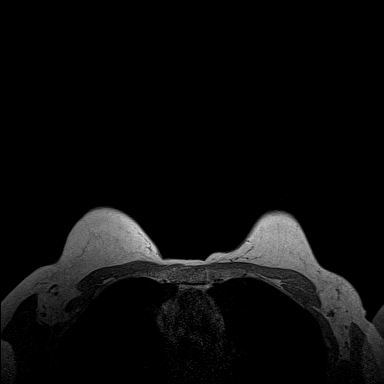
[im 144/144]
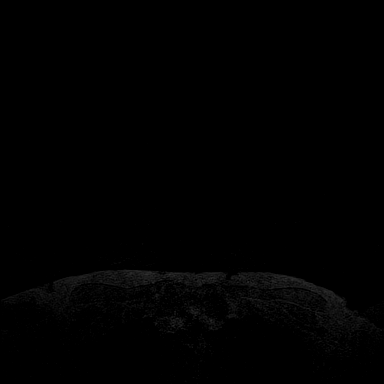

[Series 4: fl3d pre-cm · axial · non-contrast · 1.2mm · 0.86mm/px · z∈[-51,+120]mm · 5 of 144 slices shown]
[im 1/144]
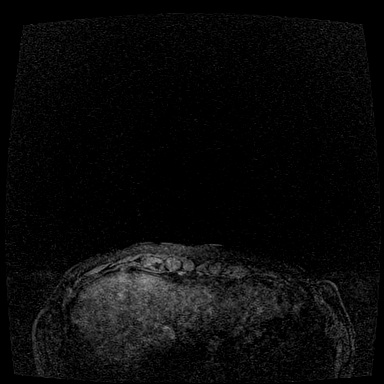
[im 36/144]
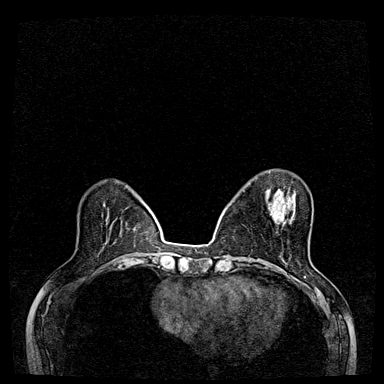
[im 72/144]
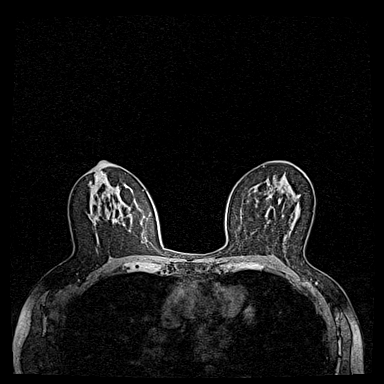
[im 108/144]
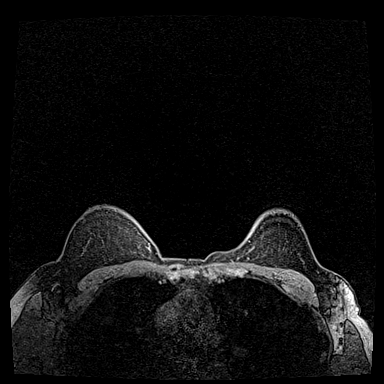
[im 144/144]
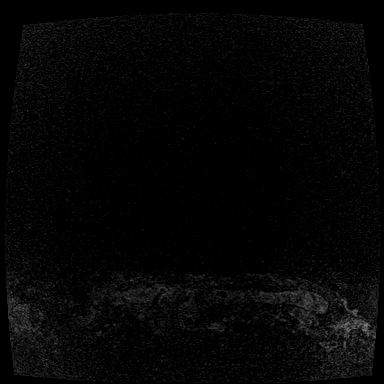

[Series 5: fl3d post-cm 20 · axial · 1.2mm · 0.86mm/px · z∈[-51,+120]mm · 5 of 144 slices shown (1 of 3)]
[im 1/144]
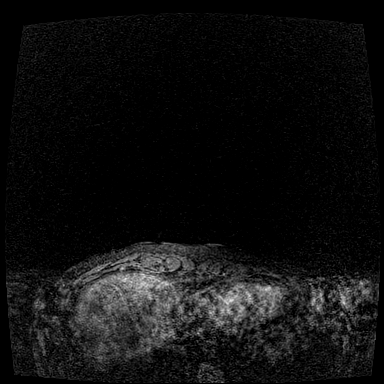
[im 36/144]
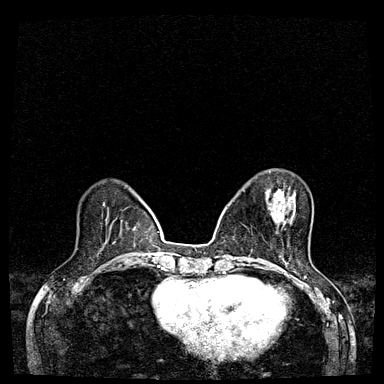
[im 72/144]
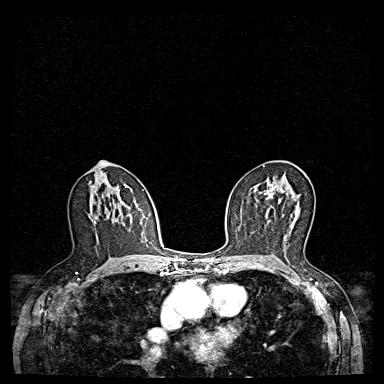
[im 108/144]
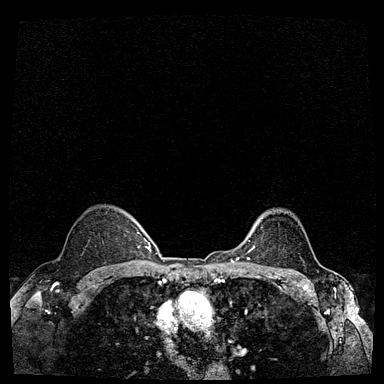
[im 144/144]
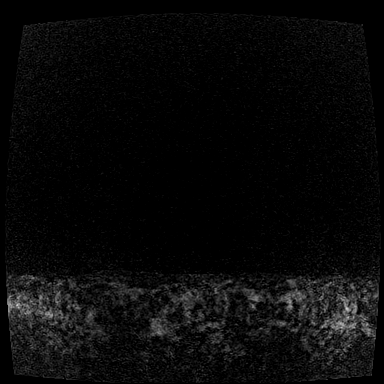

[Series 6: fl3d post-cm 20 · axial · 1.2mm · 0.86mm/px · z∈[-51,+120]mm · 5 of 144 slices shown (2 of 3)]
[im 1/144]
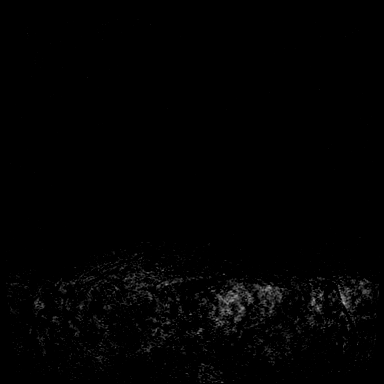
[im 36/144]
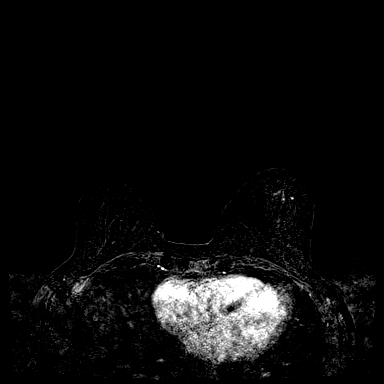
[im 72/144]
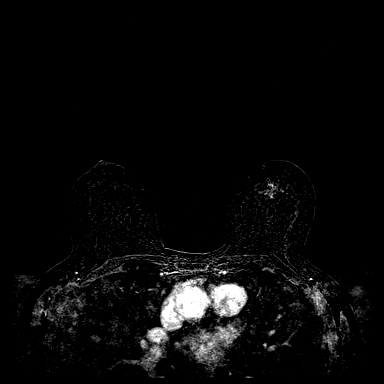
[im 108/144]
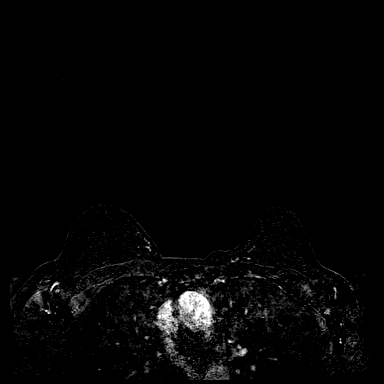
[im 144/144]
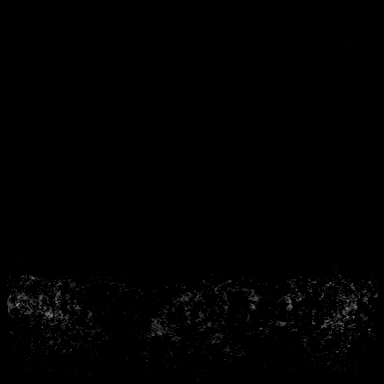

[Series 7: fl3d post-cm 20 · axial · 172.8mm · 0.86mm/px · 1 of 1 slices shown (3 of 3)]
[im 1/1]
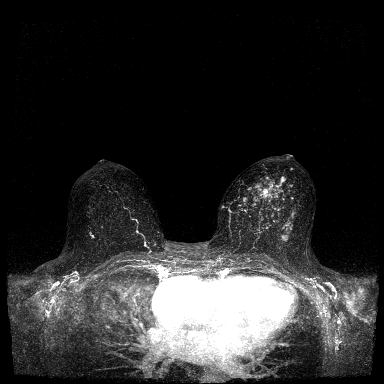

[Series 8: fl3d post-cm 3min · axial · 1.2mm · 0.86mm/px · z∈[-51,+120]mm · 6 of 144 slices shown]
[im 1/144]
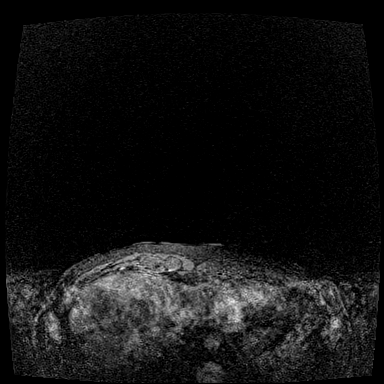
[im 29/144]
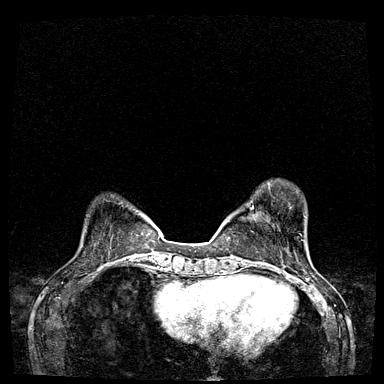
[im 58/144]
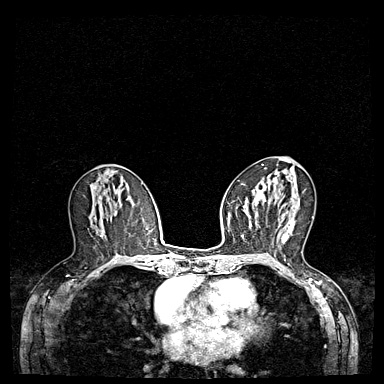
[im 86/144]
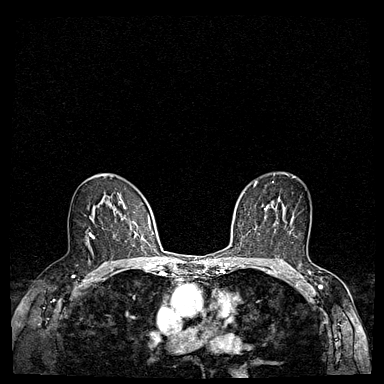
[im 115/144]
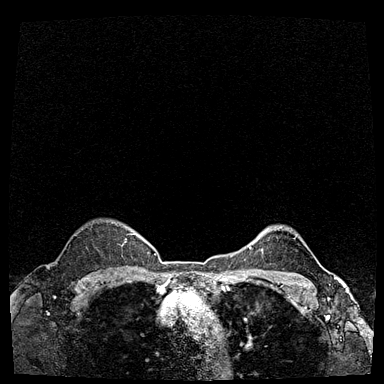
[im 144/144]
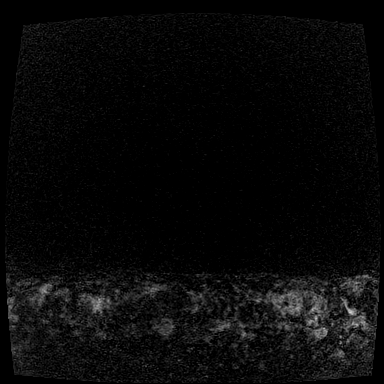

[Series 9: fl3d post-cm 3min_sub · axial · 1.2mm · 0.86mm/px · z∈[-51,+51]mm · 4 of 144 slices shown]
[im 1/144]
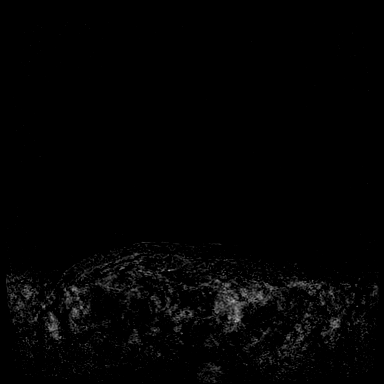
[im 29/144]
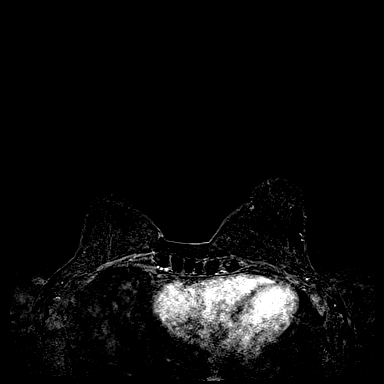
[im 58/144]
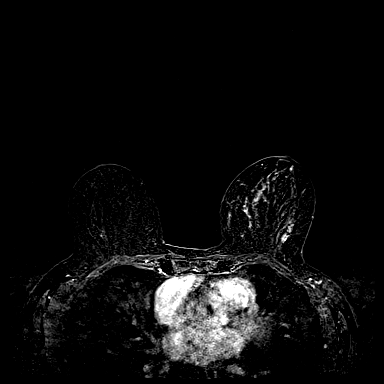
[im 86/144]
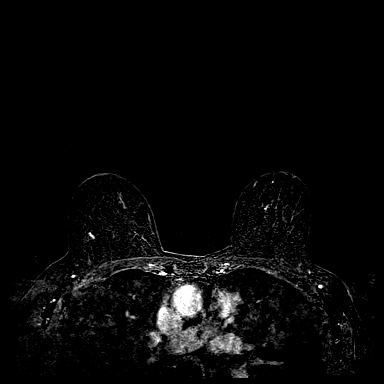

[32 of 48 positions shown; findings below may reference images not displayed]

FINDINGS: Breast composition: c. Heterogeneous fibroglandular tissue.

Background parenchymal enhancement: Mild

Right breast: There is an intramammary lymph node in the upper outer
right breast. No other suspicious enhancement in the right breast.

Left breast: There is mild enhancement in the region of the
patient's recently biopsied DCIS at 6 o'clock in the mid-posterior
left breast seen on the first subtraction series, image 103.
Enhancement is identified anteriorly in the inferior left breast,
likely extension of DCIS, well seen on subtraction image 101. There
is linear clumped non mass enhancement in the lateral inferior left
breast on subtraction image 91 spanning 2.5 cm. There is abnormal
enhancement in the superior medial left breast well seen on
subtraction images 68 through 79. No other abnormal enhancement in
the left breast.

Lymph nodes: No abnormal appearing lymph nodes.

Ancillary findings:  None.
IMPRESSION: 1. Significant abnormal enhancement in the left breast. Abnormal
enhancement is seen in the upper inner quadrant, at 6 o'clock
anteriorly, in the lateral lower breast and at the site of recent
biopsy at 6 o'clock in the mid to posterior breast. I suspect the
upper inner, lower inner, lower outer, and central left breast are
all involved with DCIS.
2. No MRI evidence of malignancy on the right.
3. No adenopathy.

RECOMMENDATION:
Recommend biopsying the abnormal enhancement in the upper inner left
breast seen on images 68 through 79. Recommend biopsy of the 6
o'clock a anterior abnormal enhancement. Recommend biopsy of the
clumped linear enhancement in the lower outer left breast. These 3
biopsies should detail the extent of disease.

BI-RADS CATEGORY  4: Suspicious.

## 2020-08-20 DIAGNOSIS — Z23 Encounter for immunization: Secondary | ICD-10-CM | POA: Diagnosis not present

## 2020-09-05 DIAGNOSIS — H2513 Age-related nuclear cataract, bilateral: Secondary | ICD-10-CM | POA: Diagnosis not present

## 2020-09-05 DIAGNOSIS — D3132 Benign neoplasm of left choroid: Secondary | ICD-10-CM | POA: Diagnosis not present

## 2020-09-05 DIAGNOSIS — H04123 Dry eye syndrome of bilateral lacrimal glands: Secondary | ICD-10-CM | POA: Diagnosis not present

## 2020-09-05 DIAGNOSIS — H25013 Cortical age-related cataract, bilateral: Secondary | ICD-10-CM | POA: Diagnosis not present

## 2020-11-15 ENCOUNTER — Encounter: Payer: Self-pay | Admitting: Oncology

## 2020-11-15 DIAGNOSIS — Z1231 Encounter for screening mammogram for malignant neoplasm of breast: Secondary | ICD-10-CM | POA: Diagnosis not present

## 2020-11-19 ENCOUNTER — Other Ambulatory Visit: Payer: Self-pay | Admitting: Cardiovascular Disease

## 2020-11-23 ENCOUNTER — Encounter: Payer: Self-pay | Admitting: Oncology

## 2020-11-23 DIAGNOSIS — R922 Inconclusive mammogram: Secondary | ICD-10-CM | POA: Diagnosis not present

## 2020-11-25 ENCOUNTER — Encounter: Payer: Self-pay | Admitting: Oncology

## 2020-11-25 DIAGNOSIS — M810 Age-related osteoporosis without current pathological fracture: Secondary | ICD-10-CM | POA: Diagnosis not present

## 2020-11-25 DIAGNOSIS — M85851 Other specified disorders of bone density and structure, right thigh: Secondary | ICD-10-CM | POA: Diagnosis not present

## 2020-12-04 NOTE — Progress Notes (Addendum)
[name]  [phone] [phone]     ID: [name] DOB: Oct 10, 1945  MR#: [id number]  LTJ#:[id number]  Patient Care Team: Carol Ada, MD as PCP - General (Family Medicine) Troy Sine, MD as PCP - Cardiology (Cardiology) Jovita Kussmaul, MD as Consulting Physician (General Surgery) Onetta Spainhower, Virgie Dad, MD as Consulting Physician (Oncology) Gery Pray, MD as Consulting Physician (Radiation Oncology) Monna Fam, MD as Consulting Physician (Ophthalmology) Troy Sine, MD as Consulting Physician (Cardiology) Lavonna Monarch, MD as Consulting Physician (Dermatology) Gatha Mayer, MD as Consulting Physician (Gastroenterology) Aloha Gell, MD as Consulting Physician (Obstetrics and Gynecology) OTHER MD:   CHIEF COMPLAINT: 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   She continues on tamoxifen.  She tolerates this well and does not foresee any reason not to continue for total of 5 years  Since her last visit, she had routine right screening mammogram on 11/15/2020 showing a possible mass. She underwent right diagnostic mammography with tomography at [facility] on 11/23/2020 showing: breast density category C; resolution of possible mass; no evidence of malignancy.   She also had a bone density on 11/25/2020.  This showed a T score of -3.1, which is consistent with osteoporosis   REVIEW OF SYSTEMS: [name] walks at least 6 days out of 7.  She also does tai chi yoga and some evening exercises at times.  She is traveled locally in [place] and [place] and is planning a trip to [place] for her husband's 80th birthday coming up.  A detailed review of systems today was otherwise stable   COVID 19 VACCINATION STATUS: Pfizer x4, most recently June  2020   HISTORY OF CURRENT ILLNESS: From the original intake note:  [name] had routine screening mammography on 10/15/2017 showing a possible abnormality in the left breast. She underwent unilateral left diagnostic mammography with tomography at [facility] on 10/18/2017 showing: breast density category C. There is a new 3.0 cm group of grouped calcifications in the left breast that are suspicious for malignancy.   Accordingly on 10/24/2017 she proceeded to biopsy of the left breast area in question. The pathology from this procedure showed ([id number]): Ductal carcinoma in situ, intermediate to high grade. Prognostic indicators significant for: estrogen receptor, 95% positive and progesterone receptor, 95% positive, both with strong staining intensity.  The patient's subsequent history is as detailed below.   PAST MEDICAL HISTORY: Past Medical History:  Diagnosis Date   Atypical angina ([place]) 08/17/2005   Exercise stress test-EF74% ,normal perfusion   Atypical nevus 10/22/2007   minimal - left cheek   Cancer ([place]) 10/2017   left breast cancer   Dysrhythmia 02/09/2009   history of SVT ; Echo normal systolic and diastolic function ,LK=>56%   Hx of colonic polyps adenomas and ssp 10/09/2017   Mitral valve prolapse    history mild MVP  Heart rate is well controlled.    PAST SURGICAL HISTORY: Past Surgical History:  Procedure Laterality Date   BREAST BIOPSY Right 1995   COLONOSCOPY     COLONOSCOPY  11/2017   MASTECTOMY W/ SENTINEL NODE BIOPSY Left 12/06/2017   Procedure: MASTECTOMY WITH SENTINEL LYMPH NODE BIOPSY;  Surgeon: Jovita Kussmaul, MD;  Location: [facility];  Service: General;  Laterality: Left;   POLYPECTOMY     TONSILLECTOMY  FAMILY HISTORY Family History  Problem Relation Age of Onset   Heart disease Father    Colon cancer Neg Hx    Esophageal cancer Neg Hx    Pancreatic cancer Neg Hx    Rectal cancer Neg Hx    Stomach cancer Neg Hx   The  patient's father died at 36 due to congestive heart failure. The patient's mother died at age 42 due to "old age ". She also had a tumor of the tear duct diagnosed at age 24. The patient had no brothers, 1 sister. She denies a history of breast or ovarian cancer in the family.  Note that the patient is of [place] but not Ashkenazi Jewish ethnicity   GYNECOLOGIC HISTORY:  No LMP recorded. Patient is postmenopausal. Menarche: 75 years old Age at first live birth: 75 years old She is [place] P2.  Her LMP was at age 39.  She used oral contraception from 8127-5170 with no complications.  She never used HRT.    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 Her husband, [name], is also retired from being a English as a second language teacher. The patient has 2 daughters: [name] who lives in [place] and works as a Scientist, forensic, and [name] who lives in [place], [place]. The patient has 2 step grandchildren   ADVANCED DIRECTIVES: The patient's husband is her 37.    HEALTH MAINTENANCE: Social History   Tobacco Use   Smoking status: Never   Smokeless tobacco: Never  Vaping Use   Vaping Use: Never used  Substance Use Topics   Alcohol use: Yes    [name]: occas glass of wine   Drug use: Never     Colonoscopy: 10/03/2017 (Dr. Carlean Purl), repeat 2022  PAP:  Bone density: 11/06/2018, -2.6 (improved from 2016)   No Known Allergies  Current Outpatient Medications  Medication Sig Dispense Refill   aspirin EC 81 MG tablet Take 1 tablet (81 mg total) by mouth 2 (two) times a week. Swallow whole. 30 tablet 11   Calcium-Vitamin D-Vitamin K (VIACTIV PO) Take by mouth.     cholecalciferol (VITAMIN D) 1000 UNITS tablet Take 1,000 Units by mouth daily.     Multiple Vitamin (MULTI-VITAMIN PO) Take by mouth.     tamoxifen (NOLVADEX) 20 MG tablet Take 1 tablet (20 mg total) by mouth daily. 90 tablet 4   TOPROL XL 25 MG 24 hr tablet Take 1.5 tablets (37.5 mg total) by mouth  daily. Pt needs to make appt with provider for further refills 135 tablet 1   No current facility-administered medications for this visit.    OBJECTIVE: White woman who appears younger than stated age 75:   12/05/20 1301  BP: (!) 119/56  Pulse: 70  Resp: 18  Temp: 98.1 F (36.7 C)  SpO2: 98%      Body mass index is 22.94 kg/m.   Wt Readings from Last 3 Encounters:  12/05/20 142 lb 1.6 oz (64.5 kg)  04/06/20 143 lb 12.8 oz (65.2 kg)  02/24/20 143 lb 3.2 oz (65 kg)      ECOG FS:1 - Symptomatic but completely ambulatory  Sclerae unicteric, EOMs intact Wearing a mask No cervical or supraclavicular adenopathy Lungs no rales or rhonchi Heart regular rate and rhythm Abd soft, nontender, positive bowel sounds MSK no focal spinal tenderness, no upper extremity lymphedema Neuro: nonfocal, well oriented, appropriate affect Breasts: The right breast is unremarkable.  The left breast status postmastectomy with no evidence of  chest wall recurrence.  Both axillae are benign.   LAB RESULTS:  CMP     Component Value Date/Time   NA 141 11/27/2018 1107   K 4.2 11/27/2018 1107   CL 105 11/27/2018 1107   CO2 30 11/27/2018 1107   GLUCOSE 82 11/27/2018 1107   BUN 18 11/27/2018 1107   CREATININE 0.81 11/27/2018 1107   CREATININE 0.72 05/10/2014 0849   CALCIUM 9.2 11/27/2018 1107   PROT 6.7 11/27/2018 1107   ALBUMIN 3.8 11/27/2018 1107   AST 20 11/27/2018 1107   ALT 14 11/27/2018 1107   ALKPHOS 72 11/27/2018 1107   BILITOT 1.3 (H) 11/27/2018 1107   GFRNONAA >60 11/27/2018 1107   GFRAA >60 11/27/2018 1107    No results found for: TOTALPROTELP, ALBUMINELP, A1GS, A2GS, BETS, BETA2SER, GAMS, MSPIKE, SPEI  No results found for: KPAFRELGTCHN, LAMBDASER, KAPLAMBRATIO  Lab Results  Component Value Date   WBC 7.9 11/27/2018   NEUTROABS 4.5 11/27/2018   HGB 14.1 11/27/2018   HCT 42.9 11/27/2018   MCV 96.0 11/27/2018   PLT 168 11/27/2018   No results found for: LABCA2  No  components found for: LGXQJJ941  No results for input(s): INR in the last 168 hours.  No results found for: LABCA2  No results found for: DEY814  No results found for: GYJ856  No results found for: DJS970  No results found for: CA2729  No components found for: HGQUANT  No results found for: CEA1 / No results found for: CEA1   No results found for: AFPTUMOR  No results found for: CHROMOGRNA  No results found for: HGBA, HGBA2QUANT, HGBFQUANT, HGBSQUAN (Hemoglobinopathy evaluation)   No results found for: LDH  No results found for: IRON, TIBC, IRONPCTSAT (Iron and TIBC)  No results found for: FERRITIN  Urinalysis No results found for: COLORURINE, APPEARANCEUR, LABSPEC, PHURINE, GLUCOSEU, HGBUR, BILIRUBINUR, KETONESUR, PROTEINUR, UROBILINOGEN, NITRITE, LEUKOCYTESUR   STUDIES: No results found.   ELIGIBLE FOR AVAILABLE RESEARCH PROTOCOL: no  ASSESSMENT: 75 y.o. [name], [place] woman status post left breast biopsy 10/24/2017 for ductal carcinoma in situ, grade 3, estrogen and progesterone receptor positive  (1) status post left mastectomy and sentinel lymph node sampling 12/06/2017 for a pT1b pN0, stage IA invasive lobular carcinoma, grade 2, estrogen and progesterone receptor positive, HER-2 not amplified  (2) no indication for adjuvant radiation  (3) tamoxifen started 12/20/2017  (a) bone density on 08/16/2014 shows osteoporosis with a T score of -3.0.  (b) repeat bone density 11/06/2018 shows a T score of -2.6  (c) repeat bone density 11/26/2019 shows a T score of -3.1   PLAN: Jocie is now 3 years from definitive surgery for her breast cancer with no evidence of disease recurrence.  This is very favorable.  She is tolerating tamoxifen well.  The plan is to continue that to a total of 5 years.  She is aware of the risk of endometrial cancer and we discussed today.  Certainly if she were to develop postmenopausal bleeding she would want to alert [place]  immediately.  I did not find her bone density results until right after the visit but I reviewed with her by mail and I essentially find this stable.  Recall that tamoxifen of course helps with the bone density so I hope the 1 to 2 years from now will also remain stable  She will return to see [place] in 1 year.  She knows to call for any other issue that may develop before then  She tells me  her lab work will be obtained in December when she sees Dr. Tamala Julian  Total encounter time 25 minutes.*   Emrys Mceachron, Virgie Dad, MD  12/05/20 1:10 PM Medical Oncology and Hematology [facility] [place], [place] [address] Tel. [phone]    Fax. [phone]   I, Wilburn Mylar, am acting as scribe for Dr. Virgie Dad. Hector Venne.  I, Lurline Del MD, have reviewed the above documentation for accuracy and completeness, and I agree with the above.   *Total Encounter Time as defined by the Centers for Medicare and Medicaid Services includes, in addition to the face-to-face time of a patient visit (documented in the note above) non-face-to-face time: obtaining and reviewing outside history, ordering and reviewing medications, tests or procedures, care coordination (communications with other health care professionals or caregivers) and documentation in the medical record.

## 2020-12-05 ENCOUNTER — Other Ambulatory Visit: Payer: Self-pay

## 2020-12-05 ENCOUNTER — Encounter: Payer: Self-pay | Admitting: Oncology

## 2020-12-05 ENCOUNTER — Inpatient Hospital Stay: Payer: Medicare Other | Attending: Oncology | Admitting: Oncology

## 2020-12-05 VITALS — BP 119/56 | HR 70 | Temp 98.1°F | Resp 18 | Ht 66.0 in | Wt 142.1 lb

## 2020-12-05 DIAGNOSIS — Z8719 Personal history of other diseases of the digestive system: Secondary | ICD-10-CM | POA: Insufficient documentation

## 2020-12-05 DIAGNOSIS — Z170 Estrogen receptor positive status [ER+]: Secondary | ICD-10-CM | POA: Insufficient documentation

## 2020-12-05 DIAGNOSIS — D0512 Intraductal carcinoma in situ of left breast: Secondary | ICD-10-CM | POA: Diagnosis not present

## 2020-12-05 DIAGNOSIS — Z79810 Long term (current) use of selective estrogen receptor modulators (SERMs): Secondary | ICD-10-CM | POA: Insufficient documentation

## 2020-12-05 DIAGNOSIS — C50812 Malignant neoplasm of overlapping sites of left female breast: Secondary | ICD-10-CM | POA: Diagnosis not present

## 2020-12-08 DIAGNOSIS — Z23 Encounter for immunization: Secondary | ICD-10-CM | POA: Diagnosis not present

## 2020-12-24 DIAGNOSIS — Z23 Encounter for immunization: Secondary | ICD-10-CM | POA: Diagnosis not present

## 2021-01-18 DIAGNOSIS — S90422A Blister (nonthermal), left great toe, initial encounter: Secondary | ICD-10-CM | POA: Diagnosis not present

## 2021-01-27 DIAGNOSIS — S90422D Blister (nonthermal), left great toe, subsequent encounter: Secondary | ICD-10-CM | POA: Diagnosis not present

## 2021-02-12 ENCOUNTER — Other Ambulatory Visit: Payer: Self-pay | Admitting: Oncology

## 2021-02-16 DIAGNOSIS — Z0000 Encounter for general adult medical examination without abnormal findings: Secondary | ICD-10-CM | POA: Diagnosis not present

## 2021-02-16 DIAGNOSIS — Z1389 Encounter for screening for other disorder: Secondary | ICD-10-CM | POA: Diagnosis not present

## 2021-02-16 DIAGNOSIS — E7800 Pure hypercholesterolemia, unspecified: Secondary | ICD-10-CM | POA: Diagnosis not present

## 2021-02-16 DIAGNOSIS — I471 Supraventricular tachycardia: Secondary | ICD-10-CM | POA: Diagnosis not present

## 2021-03-02 ENCOUNTER — Encounter: Payer: Self-pay | Admitting: Internal Medicine

## 2021-03-17 ENCOUNTER — Telehealth: Payer: Self-pay | Admitting: Cardiovascular Disease

## 2021-03-17 NOTE — Telephone Encounter (Signed)
STAT if HR is under 50 or over 120 (normal HR is 60-100 beats per minute)  What is your heart rate? She didn't check it.  Do you have a log of your heart rate readings (document readings)? no  Do you have any other symptoms? She said last night her heart was beating really fast it woke her up. She said it didn't feel normal at night before that.

## 2021-03-17 NOTE — Telephone Encounter (Signed)
Called patient, she states that she has been having increased episodes of palpitations during the night. They wake her up at night, and she feels her heart racing. She is not aware of her HR during this time. She states she does take her Metoprolol as directed per last note from [facility], takes 25 mg in the morning, and 12.5 mg in the evening before bed. She states last night and the night before she was awaken by the fast hr. She denies chest pain, shortness of breath, but states she has a fullness feeling in her chest. She states she can burp a few times and it feels better. She also mentions having gas. She has not taken any otc antacids (tums etc.). she has not had any episodes in a long time, but had two recently back to back to this concerned her. She is going out of town in 2 weeks on a cruise and would like to make sure she is okay. I did advise her to try to take a tums when this occurred to see if the symptoms subsided, and I would route a message to advise further and give any other recommendations.   She requested an appointment for Monday, but I notified her we did not have any openings at this time and we could let her know if any came open.   Thank you!

## 2021-04-12 NOTE — Telephone Encounter (Signed)
Try increasing metoprolol to 25 mg bid and try OTC pepcid

## 2021-04-13 NOTE — Telephone Encounter (Signed)
Left message to call back  

## 2021-04-27 NOTE — Telephone Encounter (Signed)
Noted! Thank you

## 2021-04-27 NOTE — Telephone Encounter (Signed)
Spoke with patient who has been on vacation and out of phone range. She just received the message last night. I informed her that Dr. Claiborne Billings wanted to increase the metoprolol to twice a day and to use OTC Pepcid. Patient stated the Tums helped her and she no longer has palpitations and does not want to increase her metoprolol dose. Recommended that she call the clinic if the palpitations return. She has an appointment with Dr. Claiborne Billings on 3/16.

## 2021-04-27 NOTE — Telephone Encounter (Signed)
° °  Pt is returning call, she said she just got back from cruise and didn't hear the message until today

## 2021-05-14 ENCOUNTER — Other Ambulatory Visit: Payer: Self-pay | Admitting: Cardiovascular Disease

## 2021-05-25 ENCOUNTER — Ambulatory Visit (INDEPENDENT_AMBULATORY_CARE_PROVIDER_SITE_OTHER): Payer: Medicare Other | Admitting: Cardiovascular Disease

## 2021-05-25 ENCOUNTER — Other Ambulatory Visit: Payer: Self-pay

## 2021-05-25 ENCOUNTER — Encounter: Payer: Self-pay | Admitting: Cardiovascular Disease

## 2021-05-25 VITALS — BP 100/60 | HR 70 | Ht 66.0 in | Wt 140.2 lb

## 2021-05-25 DIAGNOSIS — I341 Nonrheumatic mitral (valve) prolapse: Secondary | ICD-10-CM | POA: Diagnosis not present

## 2021-05-25 DIAGNOSIS — R0789 Other chest pain: Secondary | ICD-10-CM | POA: Diagnosis not present

## 2021-05-25 DIAGNOSIS — R002 Palpitations: Secondary | ICD-10-CM | POA: Diagnosis not present

## 2021-05-25 DIAGNOSIS — Z853 Personal history of malignant neoplasm of breast: Secondary | ICD-10-CM | POA: Diagnosis not present

## 2021-05-25 MED ORDER — TOPROL XL 25 MG PO TB24: ORAL_TABLET | ORAL | 3 refills | Status: DC

## 2021-05-25 NOTE — Patient Instructions (Signed)
Medication Instructions:  ?Your physician recommends that you continue on your current medications as directed. Please refer to the Current Medication list given to you today.  ? ?*If you need a refill on your cardiac medications before your next appointment, please call your pharmacy* ? ? ?Lab Work: ?NONE ?If you have labs (blood work) drawn today and your tests are completely normal, you will receive your results only by: ?MyChart Message (if you have MyChart) OR ?A paper copy in the mail ?If you have any lab test that is abnormal or we need to change your treatment, we will call you to review the results. ? ? ?Testing/Procedures: ?NONE ? ? ?Follow-Up: ?At [facility], you and your health needs are our priority.  As part of our continuing mission to provide you with exceptional heart care, we have created designated Provider Care Teams.  These Care Teams include your primary Cardiologist (physician) and Advanced Practice Providers (APPs -  Physician Assistants and Nurse Practitioners) who all work together to provide you with the care you need, when you need it. ? ?We recommend signing up for the patient portal called "MyChart".  Sign up information is provided on this After Visit Summary.  MyChart is used to connect with patients for Virtual Visits (Telemedicine).  Patients are able to view lab/test results, encounter notes, upcoming appointments, etc.  Non-urgent messages can be sent to your provider as well.   ?To learn more about what you can do with MyChart, go to [email or web address].   ? ?Your next appointment:   ?12 month(s) ? ?The format for your next appointment:   ?In Person ? ?Provider:   ?Shelva Majestic, MD { ? ?

## 2021-05-25 NOTE — Progress Notes (Signed)
Patient ID: [name], female   DOB: 08/16/1945, 76 y.o.   MRN: [id number] ? ? ? ? ? ?Primary M.D.: Dr. Carol Ada ? ?HPI: [name] is a 76 y.o. female who presents for a 14 month follow-up cardiology evaluation. ? ?[name] has a history of SVT and has been treated with beta blocker therapy with Toprol-XL 37.5 mg daily.  An echo Doppler study in 2010 showed normal systolic function.  She had mild mitral regurgitation.  She has remained fairly active.  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   ? ?When I her in February 2016 and at that time she was complaining of difficulty with waking up at night. She sleeps on her back and cannot sleep on her side since she notices palpitations.  She does snore. Remotely she had a sleep study. She also has noticed some development of left-sided chest discomfort.  She denies significant shortness of breath.  She denies presyncope or syncope.  She was very concerned about these palpitations and some episodes of chest discomfort.  She  underwent an echo Doppler study on 05/27/2014 which showed an ejection fraction at 60-65%.  There was grade 1 diastolic dysfunction.  She had documented late systolic bileaflet mitral valve prolapse with trivial mitral regurgitation.  There was mild tricuspid regurgitation.  Estimated PA pressures were normal at 22 mm.  She underwent a nuclear perfusion study which was normal.  She had excellent exercise capacity and exercise for 10 minutes in the Bruce protocol without chest pain or ECG changes.  Scintigraphic images were normal.   I recommended that he change the way she was taking her metoprolol succinate and recommended that she take 25 mg in the morning and 12.5 mg at night.  She feels that this has dramatically improved her symptomatic palpitations nocturnally.  She denies daytime fatigue.   ? ?I  saw her in May 2018 at which time she was doing well and denied any chest pain or palpitations.  Her palpitations which  were occurring nocturnally significantly improved with the addition of a 12.5 mg Toprol dose at night in addition to her 25 mg in the morning.  ? ?I  saw her in July 2019 at which time she was doing well and was only experiencing very short-lived palpitations which resolved spontaneously.  She did not have any chest pain and denied any change in exercise tolerance.  ? ?She was diagnosed with breast cancer and is being followed by Dr. Jana Hakim.  She was found to have estrogen receptor positive ductal carcinoma in situ and is status post left mastectomy.  She currently is being treated with tamoxifen.  She tolerated her surgery well.   ? ?I last saw her in October 2020 at which time she was doing well and was without chest pain.  She was walking 1 to 1-1/2 miles per day without chest pain.  She was unaware of recent palpitations.   ? ?She was evaluated by [name], NP in December 2021 when she presented with chest pain.  These were not exertionally precipitated to be nonischemic in etiology.  I last saw her In January 2022. Presently these symptoms have subsided.  She is now taking Toprol 25 mg in the morning and 12.5 mg at night which is resolved her previous nocturnal palpitations.  She continues to be on aspirin.  She is on tamoxifen 20 mg daily. ? ?Past Medical History:  ?Diagnosis Date  ? Atypical angina ([place]) 08/17/2005  ?  Exercise stress test-EF74% ,normal perfusion  ? Atypical nevus 10/22/2007  ? minimal - left cheek  ? Cancer ([place]) 10/2017  ? left breast cancer  ? Dysrhythmia 02/09/2009  ? history of SVT ; Echo normal systolic and diastolic function ,HO=>64%  ? Hx of colonic polyps adenomas and ssp 10/09/2017  ? Mitral valve prolapse   ? history mild MVP  ? ? ?Past Surgical History:  ?Procedure Laterality Date  ? BREAST BIOPSY Right 1995  ? COLONOSCOPY    ? COLONOSCOPY  11/2017  ? MASTECTOMY W/ SENTINEL NODE BIOPSY Left 12/06/2017  ? Procedure: MASTECTOMY WITH SENTINEL LYMPH NODE BIOPSY;  Surgeon: Jovita Kussmaul, MD;  Location: [facility];  Service: General;  Laterality: Left;  ? POLYPECTOMY    ? TONSILLECTOMY    ? ? ?No Known Allergies ? ?Current Outpatient Medications  ?Medication Sig Dispense Refill  ? Calcium-Vitamin D-Vitamin K (VIACTIV PO) Take by mouth.    ? cholecalciferol (VITAMIN D) 1000 UNITS tablet Take 1,000 Units by mouth daily.    ? Multiple Vitamin (MULTI-VITAMIN PO) Take by mouth.    ? tamoxifen (NOLVADEX) 20 MG tablet TAKE 1 TABLET BY MOUTH EVERY DAY 90 tablet 4  ? TOPROL XL 25 MG 24 hr tablet TAKE 1 &1/2 TABS BY MOUTH DAILY. PT NEEDS TO MAKE APPT WITH PROVIDER FOR FURTHER REFILLS 135 tablet 1  ? ?No current facility-administered medications for this visit.  ? ? ?Social History  ? ?Socioeconomic History  ? Marital status: Married  ?  Spouse name: Not on file  ? Number of children: 2  ? Years of education: Not on file  ? Highest education level: Not on file  ?Occupational History  ? Not on file  ?Tobacco Use  ? Smoking status: Never  ? Smokeless tobacco: Never  ?Vaping Use  ? Vaping Use: Never used  ?Substance and Sexual Activity  ? Alcohol use: Yes  ?  Comment: occas glass of wine  ? Drug use: Never  ? Sexual activity: Not on file  ?Other Topics Concern  ? Not on file  ?Social History Narrative  ? Not on file  ? ?Social Determinants of Health  ? ?Financial Resource Strain: Not on file  ?Food Insecurity: Not on file  ?Transportation Needs: Not on file  ?Physical Activity: Not on file  ?Stress: Not on file  ?Social Connections: Not on file  ?Intimate Partner Violence: Not on file  ? 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  She is married, has 2 children.  She does exercise fairly regularly.  She drinks occasional alcohol.  No tobacco use. ? ?Family History  ?Problem Relation Age of Onset  ? Heart disease Father   ? Colon cancer Neg Hx   ? Esophageal cancer Neg Hx   ? Pancreatic cancer Neg Hx   ? Rectal cancer Neg Hx   ? Stomach cancer Neg Hx   ? ? ?ROS ?General:  Negative; No fevers, chills, or night sweats;  ?HEENT:  Positive with the complaint that her "nose runs all the time"  No changes in vision or hearing, difficulty swallowing ?Pulmonary: Negative; No cough, wheezing, shortness of breath, hemoptysis ?Cardiovascular:   See history of present illness ?GI: Negative; No nausea, vomiting, diarrhea, or abdominal pain ?GU: Negative; No dysuria, hematuria, or difficulty voiding ?Musculoskeletal: Negative; no myalgias, joint pain, or weakness ?Hematologic/Oncology: Positive for ductal carcinoma in situ, intermediate to high-grade, estrogen receptor and progesterone receptor positive ?Endocrine: Negative; no heat/cold intolerance;  no diabetes ?Neuro: Negative; no changes in balance, headaches ?Skin: Negative; No rashes or skin lesions ?Psychiatric: Negative; No behavioral problems, depression ?Sleep: Negative; No snoring, daytime sleepiness, hypersomnolence, bruxism, restless legs, hypnogognic hallucinations, no cataplexy ?Other comprehensive 14 point system review is negative. ? ? ?PE: ? BP 100/60   Pulse 70   Ht '5\' 6"'$  (1.676 m)   Wt 140 lb 3.2 oz (63.6 kg)   SpO2 97%   BMI 22.63 kg/m?   ? ?Repeat blood pressure by me was 118/78 ? ?Wt Readings from Last 3 Encounters:  ?05/25/21 140 lb 3.2 oz (63.6 kg)  ?12/05/20 142 lb 1.6 oz (64.5 kg)  ?04/06/20 143 lb 12.8 oz (65.2 kg)  ? ?General: Alert, oriented, no distress.  ?Skin: normal turgor, no rashes, warm and dry ?HEENT: Normocephalic, atraumatic. Pupils equal round and reactive to light; sclera anicteric; extraocular muscles intact; Fundi ** ?Nose without nasal septal hypertrophy ?Mouth/Parynx benign; Mallinpatti scale 2 ?Neck: No JVD, no carotid bruits; normal carotid upstroke ?Lungs: clear to ausculatation and percussion; no wheezing or rales ?Chest wall: without tenderness to palpitation; left mastectomy ?Heart: PMI not displaced, RRR, s1 s2 normal, 1/6 systolic murmur, no diastolic murmur, no rubs, gallops, thrills, or  heaves ?Abdomen: soft, nontender; no hepatosplenomehaly, BS+; abdominal aorta nontender and not dilated by palpation. ?Back: no CVA tenderness ?Pulses 2+ ?Musculoskeletal: full range of motion, normal strength

## 2021-08-03 DIAGNOSIS — M25562 Pain in left knee: Secondary | ICD-10-CM | POA: Diagnosis not present

## 2021-08-03 DIAGNOSIS — M65311 Trigger thumb, right thumb: Secondary | ICD-10-CM | POA: Diagnosis not present

## 2021-08-04 ENCOUNTER — Ambulatory Visit
Admission: RE | Admit: 2021-08-04 | Discharge: 2021-08-04 | Disposition: A | Discharge status: Home / Self Care | Payer: Medicare Other | Source: Ambulatory Visit | Attending: Sports Medicine | Admitting: Sports Medicine

## 2021-08-04 ENCOUNTER — Other Ambulatory Visit: Payer: Self-pay | Admitting: Sports Medicine

## 2021-08-04 DIAGNOSIS — M25562 Pain in left knee: Secondary | ICD-10-CM | POA: Diagnosis not present

## 2021-08-04 DIAGNOSIS — R52 Pain, unspecified: Secondary | ICD-10-CM

## 2021-08-28 DIAGNOSIS — M65311 Trigger thumb, right thumb: Secondary | ICD-10-CM | POA: Diagnosis not present

## 2021-08-28 DIAGNOSIS — M25562 Pain in left knee: Secondary | ICD-10-CM | POA: Diagnosis not present

## 2021-09-05 DIAGNOSIS — H25813 Combined forms of age-related cataract, bilateral: Secondary | ICD-10-CM | POA: Diagnosis not present

## 2021-09-05 DIAGNOSIS — D3132 Benign neoplasm of left choroid: Secondary | ICD-10-CM | POA: Diagnosis not present

## 2021-09-05 DIAGNOSIS — H524 Presbyopia: Secondary | ICD-10-CM | POA: Diagnosis not present

## 2021-09-05 DIAGNOSIS — H43813 Vitreous degeneration, bilateral: Secondary | ICD-10-CM | POA: Diagnosis not present

## 2021-09-05 DIAGNOSIS — H04123 Dry eye syndrome of bilateral lacrimal glands: Secondary | ICD-10-CM | POA: Diagnosis not present

## 2021-11-16 DIAGNOSIS — Z1231 Encounter for screening mammogram for malignant neoplasm of breast: Secondary | ICD-10-CM | POA: Diagnosis not present

## 2021-11-24 DIAGNOSIS — Z23 Encounter for immunization: Secondary | ICD-10-CM | POA: Diagnosis not present

## 2021-12-05 ENCOUNTER — Inpatient Hospital Stay: Payer: Medicare Other | Attending: Hematology and Oncology | Admitting: Hematology and Oncology

## 2021-12-05 ENCOUNTER — Encounter: Payer: Self-pay | Admitting: Hematology and Oncology

## 2021-12-05 ENCOUNTER — Other Ambulatory Visit: Payer: Self-pay

## 2021-12-05 VITALS — BP 112/77 | HR 74 | Temp 98.1°F | Resp 16 | Ht 66.0 in | Wt 141.0 lb

## 2021-12-05 DIAGNOSIS — C50912 Malignant neoplasm of unspecified site of left female breast: Secondary | ICD-10-CM | POA: Diagnosis not present

## 2021-12-05 DIAGNOSIS — M810 Age-related osteoporosis without current pathological fracture: Secondary | ICD-10-CM | POA: Diagnosis not present

## 2021-12-05 DIAGNOSIS — Z9012 Acquired absence of left breast and nipple: Secondary | ICD-10-CM | POA: Insufficient documentation

## 2021-12-05 DIAGNOSIS — Z170 Estrogen receptor positive status [ER+]: Secondary | ICD-10-CM | POA: Diagnosis not present

## 2021-12-05 DIAGNOSIS — C50812 Malignant neoplasm of overlapping sites of left female breast: Secondary | ICD-10-CM

## 2021-12-05 DIAGNOSIS — Z79810 Long term (current) use of selective estrogen receptor modulators (SERMs): Secondary | ICD-10-CM | POA: Insufficient documentation

## 2021-12-05 NOTE — Progress Notes (Signed)
[name]  [phone] [phone]     ID: [name] DOB: 02-Oct-1945  MR#: [id number]  XNT#:[id number]  Patient Care Team: Carol Ada, MD as PCP - General (Family Medicine) Troy Sine, MD as PCP - Cardiology (Cardiology) Jovita Kussmaul, MD as Consulting Physician (General Surgery) Magrinat, Virgie Dad, MD (Inactive) as Consulting Physician (Oncology) Gery Pray, MD as Consulting Physician (Radiation Oncology) Monna Fam, MD as Consulting Physician (Ophthalmology) Troy Sine, MD as Consulting Physician (Cardiology) Lavonna Monarch, MD (Inactive) as Consulting Physician (Dermatology) Gatha Mayer, MD as Consulting Physician (Gastroenterology) Aloha Gell, MD as Consulting Physician (Obstetrics and Gynecology) OTHER MD:   CHIEF COMPLAINT: Estrogen receptor positive ductal carcinoma in situ (s/p left mastectomy)  CURRENT TREATMENT: Tamoxifen   INTERVAL HISTORY: Shari returns today for follow-up of her estrogen receptor positive breast cancer.  Patient continues on tamoxifen, tolerating it very well.  She has noticed some mild hair thinning which she attributes likely to postmenopausal changes versus related to medication.  She also had a bone density on 11/25/2020.  This showed a T score of -3.1, which is consistent with osteoporosis Next mammogram due in September 2023  [place] x4, most recently June 2020   HISTORY OF CURRENT ILLNESS: From the original intake note:  [name] had routine screening mammography on 10/15/2017 showing a possible abnormality in the left breast. She underwent unilateral left diagnostic mammography with tomography at [facility] on 10/18/2017 showing: breast density category C. There is a new 3.0 cm group of grouped calcifications in the left breast that are suspicious for malignancy.   Accordingly on 10/24/2017 she proceeded to biopsy of the left breast  area in question. The pathology from this procedure showed ([id number]): Ductal carcinoma in situ, intermediate to high grade. Prognostic indicators significant for: estrogen receptor, 95% positive and progesterone receptor, 95% positive, both with strong staining intensity.  The patient's subsequent history is as detailed below.   PAST MEDICAL HISTORY: Past Medical History:  Diagnosis Date   Atypical angina ([place]) 08/17/2005   Exercise stress test-EF74% ,normal perfusion   Atypical nevus 10/22/2007   minimal - left cheek   Cancer ([place]) 10/2017   left breast cancer   Dysrhythmia 02/09/2009   history of SVT ; Echo normal systolic and diastolic function ,GY=>65%   Hx of colonic polyps adenomas and ssp 10/09/2017   Mitral valve prolapse    history mild MVP  Heart rate is well controlled.    PAST SURGICAL HISTORY: Past Surgical History:  Procedure Laterality Date   BREAST BIOPSY Right 1995   COLONOSCOPY     COLONOSCOPY  11/2017   MASTECTOMY W/ SENTINEL NODE BIOPSY Left 12/06/2017   Procedure: MASTECTOMY WITH SENTINEL LYMPH NODE BIOPSY;  Surgeon: Jovita Kussmaul, MD;  Location: [facility];  Service: General;  Laterality: Left;   POLYPECTOMY     TONSILLECTOMY      FAMILY HISTORY Family History  Problem Relation Age of Onset   Heart disease Father    Colon cancer Neg Hx    Esophageal cancer Neg Hx    Pancreatic cancer Neg Hx    Rectal cancer Neg Hx    Stomach cancer Neg Hx   The patient's father died at 71 due to congestive heart failure. The patient's mother died at age 67 due to "old age ". She also had a tumor of the tear duct diagnosed at age 25. The patient had no brothers, 1  sister. She denies a history of breast or ovarian cancer in the family.  Note that the patient is of [place] but not Ashkenazi Jewish ethnicity   GYNECOLOGIC HISTORY:  No LMP recorded. Patient is postmenopausal. Menarche: 76 years old Age at first live birth: 76 years old She is [place] P2.   Her LMP was at age 76.  She used oral contraception from 7517-0017 with no complications.  She never used HRT.    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 Her husband, [name], is also retired from being a English as a second language teacher. The patient has 2 daughters: [name] who lives in [place] and works as a Scientist, forensic, and [name] who lives in [place], [place]. The patient has 2 step grandchildren   ADVANCED DIRECTIVES: The patient's husband is her 76.    HEALTH MAINTENANCE: Social History   Tobacco Use   Smoking status: Never   Smokeless tobacco: Never  Vaping Use   Vaping Use: Never used  Substance Use Topics   Alcohol use: Yes    [name]: occas glass of wine   Drug use: Never     Colonoscopy: 10/03/2017 (Dr. Carlean Purl), repeat 2022  PAP:  Bone density: 11/06/2018, -2.6 (improved from 2016)   No Known Allergies  Current Outpatient Medications  Medication Sig Dispense Refill   Calcium-Vitamin D-Vitamin K (VIACTIV PO) Take by mouth.     cholecalciferol (VITAMIN D) 1000 UNITS tablet Take 1,000 Units by mouth daily.     Multiple Vitamin (MULTI-VITAMIN PO) Take by mouth.     tamoxifen (NOLVADEX) 20 MG tablet TAKE 1 TABLET BY MOUTH EVERY DAY 90 tablet 4   TOPROL XL 25 MG 24 hr tablet TAKE 1 &1/2 TABS BY MOUTH DAILY 135 tablet 3   No current facility-administered medications for this visit.    OBJECTIVE: White woman who appears younger than stated age There were no vitals filed for this visit.     There is no height or weight on file to calculate BMI.   Wt Readings from Last 3 Encounters:  05/25/21 140 lb 3.2 oz (63.6 kg)  12/05/20 142 lb 1.6 oz (64.5 kg)  04/06/20 143 lb 12.8 oz (65.2 kg)      ECOG FS:1 - Symptomatic but completely ambulatory  Sclerae unicteric, EOMs intact General appearance: Alert, oriented and in no acute distress. Neck: No cervical or supraclavicular adenopathy Left breast status postmastectomy.  No  concern for local recurrence.  Right breast normal to inspection and palpation.  No regional adenopathy. No lower extremity swelling   LAB RESULTS:  CMP     Component Value Date/Time   NA 141 11/27/2018 1107   K 4.2 11/27/2018 1107   CL 105 11/27/2018 1107   CO2 30 11/27/2018 1107   GLUCOSE 82 11/27/2018 1107   BUN 18 11/27/2018 1107   CREATININE 0.81 11/27/2018 1107   CREATININE 0.72 05/10/2014 0849   CALCIUM 9.2 11/27/2018 1107   PROT 6.7 11/27/2018 1107   ALBUMIN 3.8 11/27/2018 1107   AST 20 11/27/2018 1107   ALT 14 11/27/2018 1107   ALKPHOS 72 11/27/2018 1107   BILITOT 1.3 (H) 11/27/2018 1107   GFRNONAA >60 11/27/2018 1107   GFRAA >60 11/27/2018 1107    No results found for: "TOTALPROTELP", "ALBUMINELP", "A1GS", "A2GS", "BETS", "BETA2SER", "GAMS", "MSPIKE", "SPEI"  No results found for: "KPAFRELGTCHN", "LAMBDASER", "KAPLAMBRATIO"  Lab Results  Component Value Date   WBC 7.9 11/27/2018   NEUTROABS 4.5 11/27/2018  HGB 14.1 11/27/2018   HCT 42.9 11/27/2018   MCV 96.0 11/27/2018   PLT 168 11/27/2018   No results found for: "LABCA2"  No components found for: "UZRVUF414"  No results for input(s): "INR" in the last 168 hours.  No results found for: "LABCA2"  No results found for: "QHQ016"  No results found for: "CAN125"  No results found for: "CAN153"  No results found for: "CA2729"  No components found for: "HGQUANT"  No results found for: "CEA1", "CEA" / No results found for: "CEA1", "CEA"   No results found for: "AFPTUMOR"  No results found for: "CHROMOGRNA"  No results found for: "HGBA", "HGBA2QUANT", "HGBFQUANT", "HGBSQUAN" (Hemoglobinopathy evaluation)   No results found for: "LDH"  No results found for: "IRON", "TIBC", "IRONPCTSAT" (Iron and TIBC)  No results found for: "FERRITIN"  Urinalysis No results found for: "COLORURINE", "APPEARANCEUR", "LABSPEC", "PHURINE", "GLUCOSEU", "HGBUR", "BILIRUBINUR", "KETONESUR", "PROTEINUR",  "UROBILINOGEN", "NITRITE", "LEUKOCYTESUR"   STUDIES: No results found.   ELIGIBLE FOR AVAILABLE RESEARCH PROTOCOL: no  ASSESSMENT: 76 y.o. [place], [place] woman status post left breast biopsy 10/24/2017 for ductal carcinoma in situ, grade 3, estrogen and progesterone receptor positive  (1) status post left mastectomy and sentinel lymph node sampling 12/06/2017 for a pT1b pN0, stage IA invasive lobular carcinoma, grade 2, estrogen and progesterone receptor positive, HER-2 not amplified  (2) no indication for adjuvant radiation  (3) tamoxifen started 12/20/2017  (a) bone density on 08/16/2014 shows osteoporosis with a T score of -3.0.  (b) repeat bone density 11/06/2018 shows a T score of -2.6  (c) repeat bone density 11/26/2019 shows a T score of -3.1             (D) repeat bone density in September 2022 once again consistent with osteoporosis   PLAN:  Ms. [name] is here for follow-up on tamoxifen. Patient is tolerating tamoxifen very well except for some hair thinning.   Physical examination today status post left mastectomy.  No concern for recurrence.   Right breast normal to inspection and palpation. She will return to clinic in 1 year or sooner as needed I have discussed about bone density management but patient at this time is not interested in pursuing additional medication Total encounter time 30 minutes.*   *Total Encounter Time as defined by the Centers for Medicare and Medicaid Services includes, in addition to the face-to-face time of a patient visit (documented in the note above) non-face-to-face time: obtaining and reviewing outside history, ordering and reviewing medications, tests or procedures, care coordination (communications with other health care professionals or caregivers) and documentation in the medical record.

## 2021-12-13 DIAGNOSIS — Z23 Encounter for immunization: Secondary | ICD-10-CM | POA: Diagnosis not present

## 2022-02-28 ENCOUNTER — Encounter: Payer: Self-pay | Admitting: Nurse Practitioner

## 2022-03-27 ENCOUNTER — Ambulatory Visit (INDEPENDENT_AMBULATORY_CARE_PROVIDER_SITE_OTHER): Payer: Medicare Other | Admitting: Nurse Practitioner

## 2022-03-27 ENCOUNTER — Telehealth: Payer: Self-pay | Admitting: Internal Medicine

## 2022-03-27 ENCOUNTER — Encounter: Payer: Self-pay | Admitting: Nurse Practitioner

## 2022-03-27 VITALS — BP 100/70 | HR 75 | Ht 66.0 in | Wt 138.0 lb

## 2022-03-27 DIAGNOSIS — R194 Change in bowel habit: Secondary | ICD-10-CM

## 2022-03-27 DIAGNOSIS — Z860100 Personal history of colon polyps, unspecified: Secondary | ICD-10-CM

## 2022-03-27 MED ORDER — NA SULFATE-K SULFATE-MG SULF 17.5-3.13-1.6 GM/177ML PO SOLN: 1.0000 | Freq: Once | ORAL | 0 refills | Status: AC

## 2022-03-27 NOTE — Telephone Encounter (Signed)
Patient saw Jaclyn Shaggy today and was told to go ahead and schedule her colonoscopy with Dr. Carlean Purl.  Patient is scheduled for 06/06/22 at 10:30 a.m.  Please send her instructions to her MyChart as well as send her prescription for the prep to her pharmacy.  Thank you.

## 2022-03-27 NOTE — Patient Instructions (Signed)
It has been recommended to you by your physician that you have a(n) Colonoscopy completed. Per your request, we did not schedule the procedure(s) today. Please contact our office at [phone] should you decide to have the procedure completed. You will be scheduled for a pre-visit and procedure at that time.  Benefiber- 1 tablespoon daily to bulk up stool  Thank you for trusting me with your gastrointestinal care!   Carl Best, CRNP

## 2022-03-27 NOTE — Progress Notes (Signed)
03/27/2022 [name] [id number] 1945/06/09   CHIEF COMPLAINT: Discuss scheduling a colonoscopy   HISTORY OF PRESENT ILLNESS: [name] is a 77 year old female with a past medical history of palpitations/SVT, breast cancer, s/p left mastectomy 11/2017 and colon polyps. She presents today to schedule a colonoscopy.  She also describes being a change in her bowel pattern.  She passes balls of formed stool approximately 30 minutes after eating breakfast and a little while later she passes gas per the rectum with caramel colored mucus matter.  No rectal bleeding or purulent anorectal discharge.  No associated anorectal pain.  No abdominal pain.  She underwent a colonoscopy by Dr. Carlean Purl 10/03/2017 which identified 3 tubular adenomatous/sessile serrated polyps removed from the cecum.  A repeat colonoscopy in 3 years was considered if medically appropriate.  No known family history of colorectal cancer.  She is due for her annual physical including routine laboratory studies 04/2022.  ECHO 05/27/2014: LV EF 60 -65%  Past Medical History:  Diagnosis Date   Atypical angina 08/17/2005   Exercise stress test-EF74% ,normal perfusion   Atypical nevus 10/22/2007   minimal - left cheek   Cancer ([place]) 10/2017   left breast cancer   Dysrhythmia 02/09/2009   history of SVT ; Echo normal systolic and diastolic function ,HE=>17%   Hx of colonic polyps adenomas and ssp 10/09/2017   Mitral valve prolapse    history mild MVP   Past Surgical History:  Procedure Laterality Date   BREAST BIOPSY Right 1995   COLONOSCOPY     COLONOSCOPY  11/2017   MASTECTOMY W/ SENTINEL NODE BIOPSY Left 12/06/2017   Procedure: MASTECTOMY WITH SENTINEL LYMPH NODE BIOPSY;  Surgeon: Jovita Kussmaul, MD;  Location: [facility];  Service: General;  Laterality: Left;   POLYPECTOMY     TONSILLECTOMY     Social History: She is married.  She has 2 daughters.  She is retired.  Non-smoker.  No alcohol use.  No  drug use.  Family History: Father with heart disease.  No known family history of esophageal, gastric or colon cancer.  No Known Allergies    Outpatient Encounter Medications as of 03/27/2022  Medication Sig   Calcium-Vitamin D-Vitamin K (VIACTIV PO) Take by mouth.   cholecalciferol (VITAMIN D) 1000 UNITS tablet Take 1,000 Units by mouth daily.   Multiple Vitamin (MULTI-VITAMIN PO) Take by mouth.   tamoxifen (NOLVADEX) 20 MG tablet TAKE 1 TABLET BY MOUTH EVERY DAY   TOPROL XL 25 MG 24 hr tablet TAKE 1 &1/2 TABS BY MOUTH DAILY   No facility-administered encounter medications on file as of 03/27/2022.    REVIEW OF SYSTEMS:  Gen: Denies fever, sweats or chills. No weight loss.  CV: Denies chest pain. No current palpitations.  Resp: Denies cough, shortness of breath of hemoptysis.  GI: Rare GERD symptoms relieved by taking Tums.  See HPI.   GU : Denies urinary burning, blood in urine, increased urinary frequency or incontinence. MS: Denies joint pain, muscles aches or weakness. Derm: Denies rash, itchiness, skin lesions or unhealing ulcers. Psych: Denies depression, anxiety, memory loss or confusion. Heme: Denies bruising, easy bleeding. Neuro:  Denies headaches, dizziness or paresthesias. Endo:  Denies any problems with DM, thyroid or adrenal function.  PHYSICAL EXAM: BP 100/70   Pulse 75   Ht '5\' 6"'$  (1.676 m)   Wt 138 lb (62.6 kg)   BMI 22.27 kg/m  General: 77 year old female in no acute distress.  Head: Normocephalic and atraumatic. Eyes:  Sclerae non-icteric, conjunctive pink. Ears: Normal auditory acuity. Mouth: Dentition intact. No ulcers or lesions.  Neck: Supple, no lymphadenopathy or thyromegaly.  Lungs: Clear bilaterally to auscultation without wheezes, crackles or rhonchi. Heart: Regular rate and rhythm. No murmur, rub or gallop appreciated.  Abdomen: Soft, nondistended. Mild fullness vs stool palpated to the right mid colon area. No masses. No hepatosplenomegaly.  Normoactive bowel sounds x 4 quadrants.  Rectal: Deferred.  Musculoskeletal: Symmetrical with no gross deformities. Skin: Warm and dry. No rash or lesions on visible extremities. Extremities: No edema. Neurological: Alert oriented x 4, no focal deficits.  Psychological:  Alert and cooperative. Normal mood and affect.  ASSESSMENT AND PLAN:  43) 77 year old female with a history of colon polyps.  Three tubular adenomatous/sessile serrated polyps removed from the cecum per colonoscopy 2019.  -Colonoscopy benefits and risks discussed including risk with sedation, risk of bleeding, perforation and infection  -Patient will contact her office when she is ready to schedule colonoscopy  2) Altered bowel pattern.  Patient is passing bowels of formed stool 30 minutes after breakfast followed by gas with light brown colored mucus per the rectum.  -Benefiber 1 tablespoon daily to bulk up stool which will hopefully exit cleanly without stool residue  3) History of SVT controlled on Toprol XL  4) Personal history of breast cancer  Further recommendations to be determined after colonoscopy completed    CC:  Carol Ada, MD

## 2022-03-27 NOTE — Telephone Encounter (Signed)
Pt instructions were sent via Mychart and Suprep was sent to [facility] in [facility] on [address].

## 2022-04-03 DIAGNOSIS — M25511 Pain in right shoulder: Secondary | ICD-10-CM | POA: Diagnosis not present

## 2022-04-20 DIAGNOSIS — M25511 Pain in right shoulder: Secondary | ICD-10-CM | POA: Diagnosis not present

## 2022-05-10 DIAGNOSIS — Z0000 Encounter for general adult medical examination without abnormal findings: Secondary | ICD-10-CM | POA: Diagnosis not present

## 2022-05-10 DIAGNOSIS — E7800 Pure hypercholesterolemia, unspecified: Secondary | ICD-10-CM | POA: Diagnosis not present

## 2022-05-10 DIAGNOSIS — Z1331 Encounter for screening for depression: Secondary | ICD-10-CM | POA: Diagnosis not present

## 2022-05-10 DIAGNOSIS — Z23 Encounter for immunization: Secondary | ICD-10-CM | POA: Diagnosis not present

## 2022-05-10 DIAGNOSIS — I471 Supraventricular tachycardia, unspecified: Secondary | ICD-10-CM | POA: Diagnosis not present

## 2022-05-10 DIAGNOSIS — I340 Nonrheumatic mitral (valve) insufficiency: Secondary | ICD-10-CM | POA: Diagnosis not present

## 2022-05-10 LAB — LAB REPORT - SCANNED: EGFR: 65

## 2022-05-13 ENCOUNTER — Other Ambulatory Visit: Payer: Self-pay | Admitting: Cardiovascular Disease

## 2022-05-14 DIAGNOSIS — M25511 Pain in right shoulder: Secondary | ICD-10-CM | POA: Diagnosis not present

## 2022-05-21 ENCOUNTER — Other Ambulatory Visit: Payer: Self-pay | Admitting: *Deleted

## 2022-05-21 MED ORDER — TAMOXIFEN CITRATE 20 MG PO TABS: 20.0000 mg | ORAL_TABLET | Freq: Every day | ORAL | 4 refills | Status: DC

## 2022-05-30 ENCOUNTER — Ambulatory Visit: Payer: Medicare Other | Attending: Cardiovascular Disease | Admitting: Cardiovascular Disease

## 2022-05-30 ENCOUNTER — Encounter: Payer: Self-pay | Admitting: Cardiovascular Disease

## 2022-05-30 DIAGNOSIS — I341 Nonrheumatic mitral (valve) prolapse: Secondary | ICD-10-CM

## 2022-05-30 DIAGNOSIS — I471 Supraventricular tachycardia, unspecified: Secondary | ICD-10-CM

## 2022-05-30 DIAGNOSIS — R002 Palpitations: Secondary | ICD-10-CM

## 2022-05-30 DIAGNOSIS — Z853 Personal history of malignant neoplasm of breast: Secondary | ICD-10-CM

## 2022-05-30 NOTE — Patient Instructions (Signed)
Medication Instructions:  Your physician recommends that you continue on your current medications as directed. Please refer to the Current Medication list given to you today.  *If you need a refill on your cardiac medications before your next appointment, please call your pharmacy*  Follow-Up: At [facility], you and your health needs are our priority.  As part of our continuing mission to provide you with exceptional heart care, we have created designated Provider Care Teams.  These Care Teams include your primary Cardiologist (physician) and Advanced Practice Providers (APPs -  Physician Assistants and Nurse Practitioners) who all work together to provide you with the care you need, when you need it.  We recommend signing up for the patient portal called "MyChart".  Sign up information is provided on this After Visit Summary.  MyChart is used to connect with patients for Virtual Visits (Telemedicine).  Patients are able to view lab/test results, encounter notes, upcoming appointments, etc.  Non-urgent messages can be sent to your provider as well.   To learn more about what you can do with MyChart, go to [email or web address].    Your next appointment:   12 month(s)  Provider:   Thomas Kelly, MD      

## 2022-05-30 NOTE — Progress Notes (Signed)
Patient ID: [name], female   DOB: Jun 30, 1945, 77 y.o.   MRN: [id number]        Primary M.D.: [name]  HPI: [name] is a 77 y.o. female who presents for a 14 month follow-up cardiology evaluation.  [name] has a history of SVT and has been treated with beta blocker therapy with Toprol-XL 37.5 mg daily.  An echo Doppler study in 2010 showed normal systolic function.  She had mild mitral regurgitation.  She has remained fairly active.  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    When I her in February 2016 and at that time she was complaining of difficulty with waking up at night. She sleeps on her back and cannot sleep on her side since she notices palpitations.  She does snore. Remotely she had a sleep study. She also has noticed some development of left-sided chest discomfort.  She denies significant shortness of breath.  She denies presyncope or syncope.  She was very concerned about these palpitations and some episodes of chest discomfort.  She  underwent an echo Doppler study on 05/27/2014 which showed an ejection fraction at 60-65%.  There was grade 1 diastolic dysfunction.  She had documented late systolic bileaflet mitral valve prolapse with trivial mitral regurgitation.  There was mild tricuspid regurgitation.  Estimated PA pressures were normal at 22 mm.  She underwent a nuclear perfusion study which was normal.  She had excellent exercise capacity and exercise for 10 minutes in the Bruce protocol without chest pain or ECG changes.  Scintigraphic images were normal.   I recommended that he change the way she was taking her metoprolol succinate and recommended that she take 25 mg in the morning and 12.5 mg at night.  She feels that this has dramatically improved her symptomatic palpitations nocturnally.  She denies daytime fatigue.    I  saw her in May 2018 at which time she was doing well and denied any chest pain or palpitations.  Her palpitations  which were occurring nocturnally significantly improved with the addition of a 12.5 mg Toprol dose at night in addition to her 25 mg in the morning.   I saw her in July 2019 at which time she was doing well and was only experiencing very short-lived palpitations which resolved spontaneously.  She did not have any chest pain and denied any change in exercise tolerance.   She was diagnosed with breast cancer and is being followed by [name].  She was found to have estrogen receptor positive ductal carcinoma in situ and is status post left mastectomy.  She currently is being treated with tamoxifen.  She tolerated her surgery well.    I saw her in October 2020 at which time she was doing well and was without chest pain.  She was walking 1 to 1-1/2 miles per day without chest pain.  She was unaware of recent palpitations.    She was evaluated by [name], [name] in December 2021 when she presented with chest pain.  These were not exertionally precipitated to be nonischemic in etiology. When I saw her in January 2022 the symptoms had subsided.  She was taking Toprol 25 mg in the morning and 12.5 mg at night which also resolved her nocturnal palpitations.  She continues to be on aspirin and was on tamoxifen with her breast CVA history.    I last saw her on May 25, 2021 at which time she remained stable  cardiovascularly.  CT showed sinus rhythm at 70 without ectopy.  Blood pressure was stable without significant orthostatic drop.  She is to undergo colonoscopy next week with Dr. Carlean Purl and was concerned about her bowel prep.  She is now on Toprol-XL 37.5 mg daily.  [name] has retired and she now sees [name] also remote breast cancer and she tells me he will be 5 years on tamoxifen in September at which time this may be discontinued.  She presents for follow-up evaluation.   Past Medical History:  Diagnosis Date   Atypical angina 08/17/2005   Exercise stress test-EF74% ,normal perfusion    Atypical nevus 10/22/2007   minimal - left cheek   Cancer ([place]) 10/2017   left breast cancer   Dysrhythmia 02/09/2009   history of SVT ; Echo normal systolic and diastolic function ,123456   Hx of colonic polyps adenomas and ssp 10/09/2017   Mitral valve prolapse    history mild MVP    Past Surgical History:  Procedure Laterality Date   BREAST BIOPSY Right 1995   COLONOSCOPY     COLONOSCOPY  11/2017   MASTECTOMY W/ SENTINEL NODE BIOPSY Left 12/06/2017   Procedure: MASTECTOMY WITH SENTINEL LYMPH NODE BIOPSY;  Surgeon: Kristina Kussmaul, MD;  Location: [place];  Service: General;  Laterality: Left;   POLYPECTOMY     TONSILLECTOMY      No Known Allergies  Current Outpatient Medications  Medication Sig Dispense Refill   calcium carbonate (TUMS EX) 750 MG chewable tablet Chew 1 tablet by mouth daily.     Calcium-Vitamin D-Vitamin K (VIACTIV PO) Take by mouth.     cholecalciferol (VITAMIN D) 1000 UNITS tablet Take 1,000 Units by mouth daily.     Multiple Vitamin (MULTI-VITAMIN PO) Take by mouth.     tamoxifen (NOLVADEX) 20 MG tablet Take 1 tablet (20 mg total) by mouth daily. 90 tablet 4   TOPROL XL 25 MG 24 hr tablet TAKE 1 &1/2 TABS BY MOUTH DAILY 135 tablet 3   No current facility-administered medications for this visit.    Social History   Socioeconomic History   Marital status: Married    Spouse name: Not on file   Number of children: 2   Years of education: Not on file   Highest education level: Not on file  Occupational History   Not on file  Tobacco Use   Smoking status: Never   Smokeless tobacco: Never  Vaping Use   Vaping Use: Never used  Substance and Sexual Activity   Alcohol use: Yes    Comment: occas glass of wine   Drug use: Never   Sexual activity: Not on file  Other Topics Concern   Not on file  Social History Narrative   Not on file   Social Determinants of Health   Financial Resource Strain: Not on file  Food Insecurity: Not  on file  Transportation Needs: Not on file  Physical Activity: Not on file  Stress: Not on file  Social Connections: Not on file  Intimate Partner Violence: Not on file   Socially, 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  She is married, has 2 children.  She does exercise fairly regularly.  She drinks occasional alcohol.  No tobacco use.  Family History  Problem Relation Age of Onset   Heart disease Father    Colon cancer Neg Hx    Esophageal cancer Neg Hx    Pancreatic cancer Neg Hx  Rectal cancer Neg Hx    Stomach cancer Neg Hx     ROS General: Negative; No fevers, chills, or night sweats;  HEENT:  Positive with the complaint that her "nose runs all the time"  No changes in vision or hearing, difficulty swallowing Pulmonary: Negative; No cough, wheezing, shortness of breath, hemoptysis Cardiovascular:   See history of present illness GI: Negative; No nausea, vomiting, diarrhea, or abdominal pain GU: Negative; No dysuria, hematuria, or difficulty voiding Musculoskeletal: Negative; no myalgias, joint pain, or weakness Hematologic/Oncology: Positive for ductal carcinoma in situ, intermediate to high-grade, estrogen receptor and progesterone receptor positive Endocrine: Negative; no heat/cold intolerance; no diabetes Neuro: Negative; no changes in balance, headaches Skin: Negative; No rashes or skin lesions Psychiatric: Negative; No behavioral problems, depression Sleep: Negative; No snoring, daytime sleepiness, hypersomnolence, bruxism, restless legs, hypnogognic hallucinations, no cataplexy Other comprehensive 14 point system review is negative.   PE:  BP 118/72   Pulse 63   Ht 5\' 6"  (1.676 m)   Wt 138 lb 3.2 oz (62.7 kg)   BMI 22.31 kg/m    Repeat blood pressure by me was 120/68  Wt Readings from Last 3 Encounters:  05/30/22 138 lb 3.2 oz (62.7 kg)  03/27/22 138 lb (62.6 kg)  12/05/21 141 lb (64 kg)   General: Alert, oriented, no distress.  Skin:  normal turgor, no rashes, warm and dry HEENT: Normocephalic, atraumatic. Pupils equal round and reactive to light; sclera anicteric; extraocular muscles intact;  Nose without nasal septal hypertrophy Mouth/Parynx benign; Mallinpatti scale 2 Neck: No JVD, no carotid bruits; normal carotid upstroke Lungs: clear to ausculatation and percussion; no wheezing or rales Chest wall: without tenderness to palpitation Heart: PMI not displaced, RRR, s1 s2 normal, 1/6 systolic murmur with systolic click,  no diastolic murmur, no rubs, gallops, thrills, or heaves Abdomen: soft, nontender; no hepatosplenomehaly, BS+; abdominal aorta nontender and not dilated by palpation. Back: no CVA tenderness Pulses 2+ Musculoskeletal: full range of motion, normal strength, no joint deformities Extremities: no clubbing cyanosis or edema, Homan's sign negative  Neurologic: grossly nonfocal; Cranial nerves grossly wnl Psychologic: Normal mood and affect   May 30, 2022  ECG (independently read by me): NSR at 63, no ectopy  May 25, 2021 ECG (independently read by me):  NSR at 70, no ectopy  April 06, 2020 ECG (independently read by me): NSR at 63, possible LAE, no ectopy, normal intervals  October 2020 ECG (independently read by me): NSR at 61; normal intervals;no ectopy  July 2019 ECG (independently read by me): Normal sinus rhythm with one isolated PVC.  Heart rate 69 bpm.  Normal intervals.  May 2018 ECG (independently read by me): Normal sinus rhythm 68 bpm.  UTC.  Interval 423 ms.  PR interval 160 ms.  April 2017 ECG (independently read by me): Normal sinus rhythm at 65.  No ectopy.  Normal intervals.  QTC 436 ms.  05/05/2014 ECG (independently read by me):  Normal sinus rhythm at 75 bpm. No significant ST segment changes.  Prior ECG (independently read by me): Sinus rhythm at 59 beats per minute.  Nondiagnostic T changes V1, V2.  Normal intervals.  LABS:     Latest Ref Rng & Units 11/27/2018   11:07  AM 11/06/2017   12:22 PM 05/10/2014    8:49 AM  BMP  Glucose 70 - 99 mg/dL 82  110  81   BUN 8 - 23 mg/dL 18  21  22    Creatinine 0.44 - 1.00  mg/dL 0.81  0.91  0.72   Sodium 135 - 145 mmol/L 141  140  138   Potassium 3.5 - 5.1 mmol/L 4.2  4.7  4.6   Chloride 98 - 111 mmol/L 105  103  102   CO2 22 - 32 mmol/L 30  29  28    Calcium 8.9 - 10.3 mg/dL 9.2  9.9  9.8        Latest Ref Rng & Units 11/27/2018   11:07 AM 11/06/2017   12:22 PM 05/10/2014    8:49 AM  Hepatic Function  Total Protein 6.5 - 8.1 g/dL 6.7  7.0  6.7   Albumin 3.5 - 5.0 g/dL 3.8  4.0  4.2   AST 15 - 41 U/L 20  19  18    ALT 0 - 44 U/L 14  14  14    Alk Phosphatase 38 - 126 U/L 72  110  103   Total Bilirubin 0.3 - 1.2 mg/dL 1.3  2.0  2.2        Latest Ref Rng & Units 11/27/2018   11:07 AM 11/06/2017   12:22 PM 05/10/2014    8:49 AM  CBC  WBC 4.0 - 10.5 K/uL 7.9  8.1  6.7   Hemoglobin 12.0 - 15.0 g/dL 14.1  14.8  15.2   Hematocrit 36.0 - 46.0 % 42.9  44.9  46.9   Platelets 150 - 400 K/uL 168  165  203    Lab Results  Component Value Date   MCV 96.0 11/27/2018   MCV 95.7 11/06/2017   MCV 95.5 05/10/2014   Lab Results  Component Value Date   TSH 1.626 05/10/2014  No results found for: "HGBA1C"  Lipid Panel     Component Value Date/Time   CHOL 189 05/10/2014 0849   TRIG 57 05/10/2014 0849   HDL 75 05/10/2014 0849   CHOLHDL 2.5 05/10/2014 0849   VLDL 11 05/10/2014 0849   LDLCALC 103 (H) 05/10/2014 0849    IMPRESSION:  1. Mitral valve prolapse   2. Paroxysmal supraventricular tachycardia   3. History of breast cancer: Status post left mastectomy   4. Palpitations     ASSESSMENT AND PLAN: [name] is a 77 year-old female with previously documented supraventricular tachycardia which has been treated successfully with beta-blocker therapy.  An echo Doppler study has confirmed bileaflet late systolic mitral valve prolapse.  She has multiple systolic clicks on exam due to this.  She has documented  normal systolic function with mild Grade I  diastolic relaxation abnormality.  A prior nuclear perfusion study has demonstrated good exercise tolerance with normal perfusion.  She had recently developed atypical chest pain leading to her presentation with [name], [name] in December 2021.  She has been on a medical regimen of Toprol-XL 25 mg in the morning and 12.5 mg at night.  She continues to take tamoxifen with her history of remote breast CVA.  She had developed nocturnal palpitations several months ago which occurred in the setting of increased stress.  She did take an extra half of Toprol that day with benefit.  Her symptoms have subsequently resolved and she is unaware of recurrence.  She had been followed by [name] for her breast CA. who has retired and she now sees [name].  Presently, she feels well and is without chest pain or shortness of breath.  She will be undergoing colonoscopy next week and she was concerned about the bowel prep which I reviewed with  her today.  Blood pressure is stable.  Continues to take Toprol-XL 37.5 mg daily and is without palpitations.  Denies any chest pain or change in exercise tolerance.  There is no shortness of breath.  She may be taken off tamoxifen in September at her follow-up oncologic evaluation.  I recommended she continue her current therapy.  I did review laboratory from May 10, 2022.  TSH was normal at 0.91.  CBC was stable with hemoglobin 14.1 hematocrit 42.5.  Chemistry was normal.  Lipid studies were excellent with total cholesterol 174 triglycerides 59 HDL 88 and LDL 74.  I will see her in 1 year for follow-up evaluation.  Kristina Sine, MD, [facility]  05/30/2022 1:37 PM

## 2022-06-06 ENCOUNTER — Ambulatory Visit (AMBULATORY_SURGERY_CENTER): Payer: Medicare Other | Admitting: Internal Medicine

## 2022-06-06 ENCOUNTER — Encounter: Payer: Self-pay | Admitting: Internal Medicine

## 2022-06-06 VITALS — BP 110/60 | HR 71 | Temp 98.0°F | Resp 11 | Ht 66.0 in | Wt 138.0 lb

## 2022-06-06 DIAGNOSIS — R194 Change in bowel habit: Secondary | ICD-10-CM

## 2022-06-06 DIAGNOSIS — D123 Benign neoplasm of transverse colon: Secondary | ICD-10-CM

## 2022-06-06 DIAGNOSIS — Z86010 Personal history of colonic polyps: Secondary | ICD-10-CM

## 2022-06-06 DIAGNOSIS — D125 Benign neoplasm of sigmoid colon: Secondary | ICD-10-CM | POA: Diagnosis not present

## 2022-06-06 DIAGNOSIS — Z09 Encounter for follow-up examination after completed treatment for conditions other than malignant neoplasm: Secondary | ICD-10-CM | POA: Diagnosis not present

## 2022-06-06 MED ORDER — SODIUM CHLORIDE 0.9 % IV SOLN: 500.0000 mL | Freq: Once | INTRAVENOUS | Status: DC

## 2022-06-06 NOTE — Progress Notes (Signed)
Called to room to assist during endoscopic procedure.  Patient ID and intended procedure confirmed with present staff. Received instructions for my participation in the procedure from the performing physician.  

## 2022-06-06 NOTE — Progress Notes (Signed)
Vss nad trans to pacu 

## 2022-06-06 NOTE — Progress Notes (Signed)
[facility] History and Physical   Primary Care Physician:  Carol Ada, MD   Reason for Procedure:   Altered bowel habits, hx polyps  Plan:    colonoscopy     HPI: [name] is a 77 y.o. female with a past medical history of palpitations/SVT, breast cancer, s/p left mastectomy 11/2017 and colon polyps.   She describes being a change in her bowel pattern.  She passes balls of formed stool approximately 30 minutes after eating breakfast and a little while later she passes gas per the rectum with caramel colored mucus matter.  No rectal bleeding or purulent anorectal discharge. This was present in January but is better now. No associated anorectal pain.  No abdominal pain.  She underwent a colonoscopy by Dr. Carlean Purl 10/03/2017 which identified 3 tubular adenomatous/sessile serrated polyps removed from the cecum.  A repeat colonoscopy in 3 years was considered if medically appropriate.  No known family history of colorectal cancer.  She is due for her annual physical including routine laboratory studies 04/2022.    Past Surgical History:  Procedure Laterality Date   BREAST BIOPSY Right 1995   COLONOSCOPY     COLONOSCOPY  11/2017   MASTECTOMY W/ SENTINEL NODE BIOPSY Left 12/06/2017   Procedure: MASTECTOMY WITH SENTINEL LYMPH NODE BIOPSY;  Surgeon: Jovita Kussmaul, MD;  Location: [facility];  Service: General;  Laterality: Left;   POLYPECTOMY     TONSILLECTOMY      Prior to Admission medications   Medication Sig Start Date End Date Taking? Authorizing Provider  calcium carbonate (TUMS EX) 750 MG chewable tablet Chew 1 tablet by mouth daily.   Yes [provider]  Calcium-Vitamin D-Vitamin K (VIACTIV PO) Take by mouth.   Yes [provider]  cholecalciferol (VITAMIN D) 1000 UNITS tablet Take 1,000 Units by mouth daily.   Yes [provider]  Multiple Vitamin (MULTI-VITAMIN PO) Take by mouth.   Yes [provider]   tamoxifen (NOLVADEX) 20 MG tablet Take 1 tablet (20 mg total) by mouth daily. 05/21/22  Yes Benay Pike, MD  TOPROL XL 25 MG 24 hr tablet TAKE 1 &1/2 TABS BY MOUTH DAILY 05/25/21  Yes Troy Sine, MD    Current Outpatient Medications  Medication Sig Dispense Refill   calcium carbonate (TUMS EX) 750 MG chewable tablet Chew 1 tablet by mouth daily.     Calcium-Vitamin D-Vitamin K (VIACTIV PO) Take by mouth.     cholecalciferol (VITAMIN D) 1000 UNITS tablet Take 1,000 Units by mouth daily.     Multiple Vitamin (MULTI-VITAMIN PO) Take by mouth.     tamoxifen (NOLVADEX) 20 MG tablet Take 1 tablet (20 mg total) by mouth daily. 90 tablet 4   TOPROL XL 25 MG 24 hr tablet TAKE 1 &1/2 TABS BY MOUTH DAILY 135 tablet 3   Current Facility-Administered Medications  Medication Dose Route Frequency Provider Last Rate Last Admin   0.9 %  sodium chloride infusion  500 mL Intravenous Once Gatha Mayer, MD        Allergies as of 06/06/2022   (No Known Allergies)    Family History  Problem Relation Age of Onset   Heart disease Father    Colon cancer Neg Hx    Esophageal cancer Neg Hx    Pancreatic cancer Neg Hx    Rectal cancer Neg Hx    Stomach cancer Neg Hx     Social History   Socioeconomic History   Marital  status: Married    Spouse name: Not on file   Number of children: 2   Years of education: Not on file   Highest education level: Not on file  Occupational History   Not on file  Tobacco Use   Smoking status: Never   Smokeless tobacco: Never  Vaping Use   Vaping Use: Never used  Substance and Sexual Activity   Alcohol use: Yes    Comment: occas glass of wine   Drug use: Never   Sexual activity: Not on file  Other Topics Concern   Not on file  Social History Narrative   Not on file   Social Determinants of Health   Financial Resource Strain: Not on file  Food Insecurity: Not on file  Transportation Needs: Not on file  Physical Activity: Not on file  Stress:  Not on file  Social Connections: Not on file  Intimate Partner Violence: Not on file    Review of Systems:  All other review of systems negative except as mentioned in the HPI.  Physical Exam: Vital signs BP (!) 177/85   Pulse 80   Temp 98 F (36.7 C)   Resp 10   Ht 5\' 6"  (1.676 m)   Wt 138 lb (62.6 kg)   SpO2 100%   BMI 22.27 kg/m   General:   Alert,  Well-developed, well-nourished, pleasant and cooperative in NAD Lungs:  Clear throughout to auscultation.   Heart:  Regular rate and rhythm; no murmurs, clicks, rubs,  or gallops. Abdomen:  Soft, nontender and nondistended. Normal bowel sounds.   Neuro/Psych:  Alert and cooperative. Normal mood and affect. A and O x 3   @Shanice Poznanski  Simonne Maffucci, MD, [facility] [phone] (pager) 06/06/2022 10:22 AM@

## 2022-06-06 NOTE — Op Note (Signed)
[place] Patient Name: [name] Procedure Date: 06/06/2022 10:19 AM MRN: [id number] Endoscopist: Gatha Mayer , MD, [id number] Age: 77 Referring MD:  Date of Birth: 12-29-1945 Gender: Female Account #: [id number] Procedure:                Colonoscopy Indications:              Surveillance: Personal history of adenomatous                            polyps on last colonoscopy > 3 years ago, Last                            colonoscopy: 2019, Incidental change in bowel                            habits noted Medicines:                Monitored Anesthesia Care Procedure:                Pre-Anesthesia Assessment:                           - Prior to the procedure, a History and Physical                            was performed, and patient medications and                            allergies were reviewed. The patient's tolerance of                            previous anesthesia was also reviewed. The risks                            and benefits of the procedure and the sedation                            options and risks were discussed with the patient.                            All questions were answered, and informed consent                            was obtained. Prior Anticoagulants: The patient has                            taken no anticoagulant or antiplatelet agents. ASA                            Grade Assessment: II - A patient with mild systemic                            disease. After reviewing the risks and benefits,  the patient was deemed in satisfactory condition to                            undergo the procedure.                           After obtaining informed consent, the colonoscope                            was passed under direct vision. Throughout the                            procedure, the patient's blood pressure, pulse, and                            oxygen saturations were monitored continuously. The                             Olympus [id number]-[id number]) Colonoscope was                            introduced through the anus and advanced to the the                            cecum, identified by appendiceal orifice and                            ileocecal valve. The patient tolerated the                            procedure well. The quality of the bowel                            preparation was good. The colonoscopy was somewhat                            difficult due to significant looping. Successful                            completion of the procedure was aided by                            straightening and shortening the scope to obtain                            bowel loop reduction and using scope torsion. The                            bowel preparation used was SUPREP via split dose                            instruction. The ileocecal valve, appendiceal  orifice, and rectum were photographed. Scope In: 10:31:43 AM Scope Out: 10:56:20 AM Scope Withdrawal Time: 0 hours 14 minutes 21 seconds  Total Procedure Duration: 0 hours 24 minutes 37 seconds  Findings:                 The perianal and digital rectal examinations were                            normal.                           Two sessile polyps were found in the sigmoid colon                            and transverse colon. The polyps were diminutive in                            size. These polyps were removed with a cold snare.                            Resection and retrieval were complete. Verification                            of patient identification for the specimen was                            done. Estimated blood loss was minimal.                           Multiple diverticula were found in the sigmoid                            colon.                           External and internal hemorrhoids were found.                           The exam was otherwise without abnormality on                             direct and retroflexion views. Complications:            No immediate complications. Estimated Blood Loss:     Estimated blood loss was minimal. Impression:               - Two diminutive polyps in the sigmoid colon and in                            the transverse colon, removed with a cold snare.                            Resected and retrieved.                           - Diverticulosis in the sigmoid colon.                           -  External and internal hemorrhoids.                           - The examination was otherwise normal on direct                            and retroflexion views.                           - Personal history of colonic polyps. 09/2017 - 2                            adenomas and ssp Recommendation:           - Patient has a contact number available for                            emergencies. The signs and symptoms of potential                            delayed complications were discussed with the                            patient. Return to normal activities tomorrow.                            Written discharge instructions were provided to the                            patient.                           - Resume previous diet.                           - Continue present medications.                           - Await pathology results.                           - No repeat colonoscopy due to age.                           - Benefiber 1 tablespoon daily to see if that                            alleviates post-defecation mucous expulsion with                            flatus Gatha Mayer, MD 06/06/2022 11:05:50 AM This report has been signed electronically.

## 2022-06-06 NOTE — Patient Instructions (Addendum)
I found and removed 2 tiny polyps that look benign. I saw diverticulosis again and your hemorrhoids were swollen - which is common with a colonoscopy prep.  I recommend you take 1 tablespoon of Benefiber every evening - I think this can help with the expulsion of mucous and feces with flatus that you experience.  I will let you know pathology results but do not recommend any more routine colonoscopy.      YOU HAD AN ENDOSCOPIC PROCEDURE TODAY AT [facility]:   Refer to the procedure report that was given to you for any specific questions about what was found during the examination.  If the procedure report does not answer your questions, please call your gastroenterologist to clarify.  If you requested that your care partner not be given the details of your procedure findings, then the procedure report has been included in a sealed envelope for you to review at your convenience later.  YOU SHOULD EXPECT: Some feelings of bloating in the abdomen. Passage of more gas than usual.  Walking can help get rid of the air that was put into your GI tract during the procedure and reduce the bloating. If you had a lower endoscopy (such as a colonoscopy or flexible sigmoidoscopy) you may notice spotting of blood in your stool or on the toilet paper. If you underwent a bowel prep for your procedure, you may not have a normal bowel movement for a few days.  Please Note:  You might notice some irritation and congestion in your nose or some drainage.  This is from the oxygen used during your procedure.  There is no need for concern and it should clear up in a day or so.  SYMPTOMS TO REPORT IMMEDIATELY:  Following lower endoscopy (colonoscopy or flexible sigmoidoscopy):  Excessive amounts of blood in the stool  Significant tenderness or worsening of abdominal pains  Swelling of the abdomen that is new, acute  Fever of 100F or higher    For urgent or emergent issues, a gastroenterologist  can be reached at any hour by calling [phone]. Do not use MyChart messaging for urgent concerns.    DIET:  We do recommend a small meal at first, but then you may proceed to your regular diet.  Drink plenty of fluids but you should avoid alcoholic beverages for 24 hours.  ACTIVITY:  You should plan to take it easy for the rest of today and you should NOT DRIVE or use heavy machinery until tomorrow (because of the sedation medicines used during the test).    FOLLOW UP: Our staff will call the number listed on your records the next business day following your procedure.  We will call around 7:15- 8:00 am to check on you and address any questions or concerns that you may have regarding the information given to you following your procedure. If we do not reach you, we will leave a message.     If any biopsies were taken you will be contacted by phone or by letter within the next 1-3 weeks.  Please call us at [phone] if you have not heard about the biopsies in 3 weeks.    SIGNATURES/CONFIDENTIALITY: You and/or your care partner have signed paperwork which will be entered into your electronic medical record.  These signatures attest to the fact that that the information above on your After Visit Summary has been reviewed and is understood.  Full responsibility of the confidentiality of this discharge information lies with  you and/or your care-partner.

## 2022-06-06 NOTE — Progress Notes (Signed)
r 

## 2022-06-07 ENCOUNTER — Telehealth: Payer: Self-pay

## 2022-06-07 NOTE — Telephone Encounter (Signed)
  Follow up Call-     06/06/2022    9:48 AM  Call back number  Post procedure Call Back phone  # [phone]  Permission to leave phone message Yes     Patient questions:  Do you have a fever, pain , or abdominal swelling? No. Pain Score  0 *  Have you tolerated food without any problems? Yes.    Have you been able to return to your normal activities? Yes.    Do you have any questions about your discharge instructions: Diet   No. Medications  No. Follow up visit  No.  Do you have questions or concerns about your Care? No.  Actions: * If pain score is 4 or above: No action needed, pain <4.

## 2022-06-16 ENCOUNTER — Encounter: Payer: Self-pay | Admitting: Internal Medicine

## 2022-08-16 ENCOUNTER — Other Ambulatory Visit: Payer: Self-pay | Admitting: Cardiovascular Disease

## 2022-09-26 DIAGNOSIS — H35363 Drusen (degenerative) of macula, bilateral: Secondary | ICD-10-CM | POA: Diagnosis not present

## 2022-09-26 DIAGNOSIS — H04123 Dry eye syndrome of bilateral lacrimal glands: Secondary | ICD-10-CM | POA: Diagnosis not present

## 2022-09-26 DIAGNOSIS — D3132 Benign neoplasm of left choroid: Secondary | ICD-10-CM | POA: Diagnosis not present

## 2022-09-26 DIAGNOSIS — H25813 Combined forms of age-related cataract, bilateral: Secondary | ICD-10-CM | POA: Diagnosis not present

## 2022-11-28 DIAGNOSIS — R1032 Left lower quadrant pain: Secondary | ICD-10-CM | POA: Diagnosis not present

## 2022-11-28 DIAGNOSIS — R8290 Unspecified abnormal findings in urine: Secondary | ICD-10-CM | POA: Diagnosis not present

## 2022-11-28 DIAGNOSIS — K5732 Diverticulitis of large intestine without perforation or abscess without bleeding: Secondary | ICD-10-CM | POA: Diagnosis not present

## 2022-11-29 DIAGNOSIS — R29890 Loss of height: Secondary | ICD-10-CM | POA: Diagnosis not present

## 2022-11-29 DIAGNOSIS — Z853 Personal history of malignant neoplasm of breast: Secondary | ICD-10-CM | POA: Diagnosis not present

## 2022-11-29 DIAGNOSIS — Z1231 Encounter for screening mammogram for malignant neoplasm of breast: Secondary | ICD-10-CM | POA: Diagnosis not present

## 2022-11-29 DIAGNOSIS — M8588 Other specified disorders of bone density and structure, other site: Secondary | ICD-10-CM | POA: Diagnosis not present

## 2022-12-06 ENCOUNTER — Inpatient Hospital Stay: Payer: Medicare Other | Attending: Hematology and Oncology | Admitting: Hematology and Oncology

## 2022-12-06 VITALS — BP 101/59 | HR 79 | Temp 97.9°F | Resp 16 | Ht 66.0 in | Wt 140.6 lb

## 2022-12-06 DIAGNOSIS — M810 Age-related osteoporosis without current pathological fracture: Secondary | ICD-10-CM | POA: Diagnosis not present

## 2022-12-06 DIAGNOSIS — Z9012 Acquired absence of left breast and nipple: Secondary | ICD-10-CM | POA: Insufficient documentation

## 2022-12-06 DIAGNOSIS — Z170 Estrogen receptor positive status [ER+]: Secondary | ICD-10-CM | POA: Diagnosis not present

## 2022-12-06 DIAGNOSIS — R3915 Urgency of urination: Secondary | ICD-10-CM | POA: Diagnosis not present

## 2022-12-06 DIAGNOSIS — M419 Scoliosis, unspecified: Secondary | ICD-10-CM | POA: Diagnosis not present

## 2022-12-06 DIAGNOSIS — C50812 Malignant neoplasm of overlapping sites of left female breast: Secondary | ICD-10-CM | POA: Diagnosis not present

## 2022-12-06 DIAGNOSIS — Z79810 Long term (current) use of selective estrogen receptor modulators (SERMs): Secondary | ICD-10-CM | POA: Insufficient documentation

## 2022-12-06 DIAGNOSIS — K5900 Constipation, unspecified: Secondary | ICD-10-CM | POA: Insufficient documentation

## 2022-12-06 NOTE — Progress Notes (Signed)
[facility]  [phone]) [phone] Fax:([phone]) [phone]     ID: [name] DOB: 23-Feb-1946  MR#: [id number]  JYN#:[id number]  Patient Care Team: Merri Brunette, MD as PCP - General (Family Medicine) Lennette Bihari, MD as PCP - Cardiology (Cardiology) Griselda Miner, MD as Consulting Physician (General Surgery) Magrinat, Valentino Hue, MD (Inactive) as Consulting Physician (Oncology) Antony Blackbird, MD as Consulting Physician (Radiation Oncology) Mateo Flow, MD as Consulting Physician (Ophthalmology) Lennette Bihari, MD as Consulting Physician (Cardiology) Janalyn Harder, MD (Inactive) as Consulting Physician (Dermatology) Iva Boop, MD as Consulting Physician (Gastroenterology) Noland Fordyce, MD as Consulting Physician (Obstetrics and Gynecology) OTHER MD:   CHIEF COMPLAINT: 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 She reports hair thinning and urinary symptoms, including urgency and incontinence. She also notes vaginal dryness. She has been adherent to Tamoxifen therapy, which she is scheduled to discontinue at this visit.  In addition, the patient has osteoporosis, which she manages with calcium supplementation and regular exercise, including yoga, Tai Chi, and therapeutic aquatics. She recently had a bone density scan, which showed osteopenia in both hips and osteoporosis in the spine. She also has scoliosis, which is asymptomatic.  The patient also reports a recent episode of constipation, which was managed with Senokot and Benefiber.  HISTORY OF CURRENT ILLNESS: From the original intake note:  [name] had routine screening mammography on 10/15/2017 showing a possible abnormality in the left breast. She underwent unilateral left diagnostic  mammography with tomography at [facility] on 10/18/2017 showing: breast density category C. There is a new 3.0 cm group of grouped calcifications in the left breast that are suspicious for malignancy.   Accordingly on 10/24/2017 she proceeded to biopsy of the left breast area in question. The pathology from this procedure showed ([id number]): Ductal carcinoma in situ, intermediate to high grade. Prognostic indicators significant for: estrogen receptor, 95% positive and progesterone receptor, 95% positive, both with strong staining intensity.  The patient's subsequent history is as detailed below.   PAST MEDICAL HISTORY: Past Medical History:  Diagnosis Date   Atypical angina 08/17/2005   Exercise stress test-EF74% ,normal perfusion   Atypical nevus 10/22/2007   minimal - left cheek   Cancer (HCC) 10/2017   left breast cancer   Dysrhythmia 02/09/2009   history of SVT ; Echo normal systolic and diastolic function ,EF=>55%   Hx of colonic polyps adenomas and ssp 10/09/2017   Mitral valve prolapse    history mild MVP   Osteoporosis   Heart rate is well controlled.    PAST SURGICAL HISTORY: Past Surgical History:  Procedure Laterality Date   BREAST BIOPSY Right 1995   COLONOSCOPY     COLONOSCOPY  11/2017   MASTECTOMY W/ SENTINEL NODE BIOPSY Left 12/06/2017   Procedure: MASTECTOMY WITH SENTINEL LYMPH NODE BIOPSY;  Surgeon: Griselda Miner, MD;  Location: [facility];  Service: General;  Laterality: Left;   POLYPECTOMY     TONSILLECTOMY      FAMILY HISTORY Family History  Problem Relation Age of Onset   Heart disease Father    Colon cancer Neg Hx    Esophageal cancer Neg Hx    Pancreatic cancer Neg Hx    Rectal cancer Neg Hx    Stomach cancer Neg Hx   The patient's father  died at 2 due to congestive heart failure. The patient's mother died at age 2 due to "old age ". She also had a tumor of the tear duct diagnosed at age 81. The patient had no brothers, 1 sister. She  denies a history of breast or ovarian cancer in the family.  Note that the patient is of [place] but not Ashkenazi Jewish ethnicity   GYNECOLOGIC HISTORY:  No LMP recorded. Patient is postmenopausal. Menarche: 77 years old Age at first live birth: 77 years old She is GX P2.  Her LMP was at age 60.  She used oral contraception from 1970-1974 with no complications.  She never used HRT.    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 Her husband, Cyndra Numbers, is also retired from being a Charity fundraiser. The patient has 2 daughters: [name] who lives in [place] and works as a Air cabin crew, and [name] who lives in [place], [place]. The patient has 2 step grandchildren   ADVANCED DIRECTIVES: The patient's husband is her HCPOA.    HEALTH MAINTENANCE: Social History   Tobacco Use   Smoking status: Never   Smokeless tobacco: Never  Vaping Use   Vaping status: Never Used  Substance Use Topics   Alcohol use: Yes    Comment: occas glass of wine   Drug use: Never     Colonoscopy: 10/03/2017 (Dr. Leone Payor), repeat 2022  PAP:  Bone density: 11/06/2018, -2.6 (improved from 2016)   No Known Allergies  Current Outpatient Medications  Medication Sig Dispense Refill   calcium carbonate (TUMS EX) 750 MG chewable tablet Chew 1 tablet by mouth daily.     Calcium-Vitamin D-Vitamin K (VIACTIV PO) Take by mouth.     cholecalciferol (VITAMIN D) 1000 UNITS tablet Take 1,000 Units by mouth daily.     Multiple Vitamin (MULTI-VITAMIN PO) Take by mouth.     tamoxifen (NOLVADEX) 20 MG tablet Take 1 tablet (20 mg total) by mouth daily. 90 tablet 4   TOPROL XL 25 MG 24 hr tablet TAKE 1 &1/2 TABS BY MOUTH DAILY 135 tablet 3   No current facility-administered medications for this visit.    OBJECTIVE: White woman who appears younger than stated age Vitals:   12/06/22 1329  BP: (!) 101/59  Pulse: 79  Resp: 16  Temp: 97.9 F (36.6 C)  SpO2: 100%        Body mass index is 22.69 kg/m.   Wt Readings from Last 3 Encounters:  12/06/22 140 lb 9.6 oz (63.8 kg)  06/06/22 138 lb (62.6 kg)  05/30/22 138 lb 3.2 oz (62.7 kg)      ECOG FS:1 - Symptomatic but completely ambulatory  Sclerae unicteric, EOMs intact General appearance: Alert, oriented and in no acute distress. Neck: No cervical or supraclavicular adenopathy Left breast status postmastectomy.  No concern for local recurrence.  Right breast normal to inspection and palpation.  No regional adenopathy. No lower extremity swelling   LAB RESULTS:  CMP     Component Value Date/Time   NA 141 11/27/2018 1107   K 4.2 11/27/2018 1107   CL 105 11/27/2018 1107   CO2 30 11/27/2018 1107   GLUCOSE 82 11/27/2018 1107   BUN 18 11/27/2018 1107   CREATININE 0.81 11/27/2018 1107   CREATININE 0.72 05/10/2014 0849   CALCIUM 9.2 11/27/2018 1107   PROT 6.7 11/27/2018 1107   ALBUMIN 3.8 11/27/2018 1107   AST 20 11/27/2018 1107   ALT 14  11/27/2018 1107   ALKPHOS 72 11/27/2018 1107   BILITOT 1.3 (H) 11/27/2018 1107   GFRNONAA >60 11/27/2018 1107   GFRAA >60 11/27/2018 1107    No results found for: "TOTALPROTELP", "ALBUMINELP", "A1GS", "A2GS", "BETS", "BETA2SER", "GAMS", "MSPIKE", "SPEI"  No results found for: "KPAFRELGTCHN", "LAMBDASER", "KAPLAMBRATIO"  Lab Results  Component Value Date   WBC 7.9 11/27/2018   NEUTROABS 4.5 11/27/2018   HGB 14.1 11/27/2018   HCT 42.9 11/27/2018   MCV 96.0 11/27/2018   PLT 168 11/27/2018   No results found for: "LABCA2"  No components found for: "YQMVHQ469"  No results for input(s): "INR" in the last 168 hours.  No results found for: "LABCA2"  No results found for: "GEX528"  No results found for: "CAN125"  No results found for: "CAN153"  No results found for: "CA2729"  No components found for: "HGQUANT"  No results found for: "CEA1", "CEA" / No results found for: "CEA1", "CEA"   No results found for: "AFPTUMOR"  No results found for:  "CHROMOGRNA"  No results found for: "HGBA", "HGBA2QUANT", "HGBFQUANT", "HGBSQUAN" (Hemoglobinopathy evaluation)   No results found for: "LDH"  No results found for: "IRON", "TIBC", "IRONPCTSAT" (Iron and TIBC)  No results found for: "FERRITIN"  Urinalysis No results found for: "COLORURINE", "APPEARANCEUR", "LABSPEC", "PHURINE", "GLUCOSEU", "HGBUR", "BILIRUBINUR", "KETONESUR", "PROTEINUR", "UROBILINOGEN", "NITRITE", "LEUKOCYTESUR"   STUDIES: No results found.   ELIGIBLE FOR AVAILABLE RESEARCH PROTOCOL: no  ASSESSMENT: 77 y.o. [place], [place] woman status post left breast biopsy 10/24/2017 for ductal carcinoma in situ, grade 3, estrogen and progesterone receptor positive  (1) status post left mastectomy and sentinel lymph node sampling 12/06/2017 for a pT1b pN0, stage IA invasive lobular carcinoma, grade 2, estrogen and progesterone receptor positive, HER-2 not amplified  (2) no indication for adjuvant radiation  (3) tamoxifen started 12/20/2017  (a) bone density on 08/16/2014 shows osteoporosis with a T score of -3.0.  (b) repeat bone density 11/06/2018 shows a T score of -2.6  (c) repeat bone density 11/26/2019 shows a T score of -3.1             (D) repeat bone density in September 2022 once again consistent with osteoporosis   PLAN:  Breast Cancer Completed Tamoxifen therapy in October 2019. Stage 1a breast cancer, low risk of recurrence. No new breast symptoms or changes noted on examination. -Discontinue Tamoxifen. -Continue annual mammograms. -Report any changes in right breast.  Genitourinary Syndrome of Menopause Reports urinary urgency and occasional incontinence, as well as vaginal dryness. Symptoms may be related to Tamoxifen use. -Observe for improvement now that Tamoxifen is discontinued.  Osteoporosis Recent bone density scan showed osteopenia in hips and osteoporosis in spine. Patient is active and taking calcium and vitamin D, but prefers not to take  additional medications. -Continue current management with exercise, calcium, and vitamin D. -Request copy of recent bone density scan for review.  Constipation Recent episode of constipation managed with Senokot and Benefiber. -Continue Benefiber as needed for constipation.  Follow-up in one year, or sooner if any new symptoms arise.   Total encounter time 30 minutes.*   *Total Encounter Time as defined by the Centers for Medicare and Medicaid Services includes, in addition to the face-to-face time of a patient visit (documented in the note above) non-face-to-face time: obtaining and reviewing outside history, ordering and reviewing medications, tests or procedures, care coordination (communications with other health care professionals or caregivers) and documentation in the medical record.

## 2022-12-10 DIAGNOSIS — Z23 Encounter for immunization: Secondary | ICD-10-CM | POA: Diagnosis not present

## 2022-12-13 DIAGNOSIS — L814 Other melanin hyperpigmentation: Secondary | ICD-10-CM | POA: Diagnosis not present

## 2022-12-13 DIAGNOSIS — L570 Actinic keratosis: Secondary | ICD-10-CM | POA: Diagnosis not present

## 2022-12-17 DIAGNOSIS — Z9229 Personal history of other drug therapy: Secondary | ICD-10-CM | POA: Diagnosis not present

## 2022-12-17 DIAGNOSIS — N950 Postmenopausal bleeding: Secondary | ICD-10-CM | POA: Diagnosis not present

## 2022-12-17 DIAGNOSIS — Z23 Encounter for immunization: Secondary | ICD-10-CM | POA: Diagnosis not present

## 2022-12-18 ENCOUNTER — Telehealth: Payer: Self-pay | Admitting: *Deleted

## 2022-12-18 DIAGNOSIS — L578 Other skin changes due to chronic exposure to nonionizing radiation: Secondary | ICD-10-CM | POA: Diagnosis not present

## 2022-12-18 DIAGNOSIS — L821 Other seborrheic keratosis: Secondary | ICD-10-CM | POA: Diagnosis not present

## 2022-12-18 DIAGNOSIS — L570 Actinic keratosis: Secondary | ICD-10-CM | POA: Diagnosis not present

## 2022-12-18 DIAGNOSIS — D229 Melanocytic nevi, unspecified: Secondary | ICD-10-CM | POA: Diagnosis not present

## 2022-12-18 DIAGNOSIS — L814 Other melanin hyperpigmentation: Secondary | ICD-10-CM | POA: Diagnosis not present

## 2022-12-18 DIAGNOSIS — D1801 Hemangioma of skin and subcutaneous tissue: Secondary | ICD-10-CM | POA: Diagnosis not present

## 2022-12-18 NOTE — Telephone Encounter (Signed)
This RN spoke with pt per her call stating she was seen on 12/06/2022 and completed 5 years of tamoxifen use.  This past Sunday evening she stood up and "felt dripping down my legs and noted brown vaginal discharge "  Discharge continued and she went to her GYN- who verified it as vaginal bleeding and scheduled pt for U/S for this coming Monday.  She states PM pad she placed at bedtime and then waking up at approximately 430 am- "was very drenched and heavy"  Discharge is dark brown and "like mud almost" - today the discharge has lightened up a lot.  Pt asked why the above is happening- since stopping the tamoxifen.  This RN discussed possibility of some endometrial thickening while on the tamoxifen which is now shedding- seeing her GYN and obtaining an U/S is what we would recommend.  Pt will keep this RN updated - no further needs at this time.

## 2022-12-24 DIAGNOSIS — Z5309 Procedure and treatment not carried out because of other contraindication: Secondary | ICD-10-CM | POA: Diagnosis not present

## 2022-12-24 DIAGNOSIS — R9389 Abnormal findings on diagnostic imaging of other specified body structures: Secondary | ICD-10-CM | POA: Diagnosis not present

## 2022-12-24 DIAGNOSIS — N882 Stricture and stenosis of cervix uteri: Secondary | ICD-10-CM | POA: Diagnosis not present

## 2022-12-24 DIAGNOSIS — N950 Postmenopausal bleeding: Secondary | ICD-10-CM | POA: Diagnosis not present

## 2022-12-28 DIAGNOSIS — N882 Stricture and stenosis of cervix uteri: Secondary | ICD-10-CM | POA: Diagnosis not present

## 2022-12-28 DIAGNOSIS — R9389 Abnormal findings on diagnostic imaging of other specified body structures: Secondary | ICD-10-CM | POA: Diagnosis not present

## 2022-12-28 DIAGNOSIS — Z853 Personal history of malignant neoplasm of breast: Secondary | ICD-10-CM | POA: Diagnosis not present

## 2022-12-28 DIAGNOSIS — R1909 Other intra-abdominal and pelvic swelling, mass and lump: Secondary | ICD-10-CM | POA: Diagnosis not present

## 2022-12-28 DIAGNOSIS — N950 Postmenopausal bleeding: Secondary | ICD-10-CM | POA: Diagnosis not present

## 2022-12-28 DIAGNOSIS — Z79810 Long term (current) use of selective estrogen receptor modulators (SERMs): Secondary | ICD-10-CM | POA: Diagnosis not present

## 2023-01-03 DIAGNOSIS — N950 Postmenopausal bleeding: Secondary | ICD-10-CM | POA: Diagnosis not present

## 2023-01-03 DIAGNOSIS — R9389 Abnormal findings on diagnostic imaging of other specified body structures: Secondary | ICD-10-CM | POA: Diagnosis not present

## 2023-01-29 ENCOUNTER — Other Ambulatory Visit: Payer: Self-pay | Admitting: Obstetrics

## 2023-01-31 ENCOUNTER — Other Ambulatory Visit (HOSPITAL_COMMUNITY): Payer: Self-pay | Admitting: Obstetrics

## 2023-01-31 DIAGNOSIS — R9389 Abnormal findings on diagnostic imaging of other specified body structures: Secondary | ICD-10-CM

## 2023-02-01 ENCOUNTER — Encounter (HOSPITAL_BASED_OUTPATIENT_CLINIC_OR_DEPARTMENT_OTHER): Payer: Self-pay | Admitting: Obstetrics

## 2023-02-01 NOTE — Progress Notes (Signed)
Spoke w/ via phone for pre-op interview--- [facility] needs dos---- CBC, T&S BMP per Careers adviser.        Lab results------Current EKG in Epic dated 05/30/22 COVID test -----patient states asymptomatic no test needed Arrive at -------0630 NPO after MN NO Solid Food.   Med rec completed Medications to take morning of surgery -----Toprol XL Diabetic medication ----- Patient instructed no nail polish to be worn day of surgery Patient instructed to bring photo id and insurance card day of surgery Patient aware to have Driver (ride ) / caregiver    for 24 hours after surgery - Husband Martie Round Patient Special Instructions ----- Pre-Op special Instructions ----- Patient verbalized understanding of instructions that were given at this phone interview. Patient denies chest pain, sob, fever, cough at the interview.

## 2023-02-11 ENCOUNTER — Telehealth: Payer: Self-pay | Admitting: Cardiovascular Disease

## 2023-02-11 NOTE — Telephone Encounter (Signed)
Spoke to [facility] he advised ok to hold Metoprolol morning of surgery.

## 2023-02-11 NOTE — Telephone Encounter (Signed)
Pt c/o medication issue:  1. Name of Medication: TOPROL XL 25 MG 24 hr tablet   2. How are you currently taking this medication (dosage and times per day)?   3. Are you having a reaction (difficulty breathing--STAT)?   4. What is your medication issue? Patient is requesting call back to discuss medication and how to take with upcoming procedure. Please advise.

## 2023-02-11 NOTE — Telephone Encounter (Signed)
Spoke to patient she stated she is having outpatient surgery ( uterine scraping with polys removed).She was told she will be heavily sedated.She is concerned if she takes am dose of Metoprolol 25 mg heart rate will be too slow.Advised ok to hold morning dose of Metoprolol.I will make Dr.Kelly aware.Advised to call GYN to make sure she will not need a cardiac clearance.

## 2023-02-12 ENCOUNTER — Encounter (HOSPITAL_BASED_OUTPATIENT_CLINIC_OR_DEPARTMENT_OTHER): Payer: Self-pay | Admitting: Obstetrics

## 2023-02-12 NOTE — H&P (Signed)
[name] is an 77 y.o. female w/hx of breast cancer s/p tamoxifen p/f hysteroscopy, D&C for PMB, endometrial polyp and cystic area on ultrasound. EBX c/w proliferative endometrium.   Pertinent Gynecological History: Menses: post-menopausal Bleeding: post menopausal bleeding Contraception: none DES exposure: denies Blood transfusions: none Sexually transmitted diseases: no past history Previous GYN Procedures: None Last mammogram: normal Date: 11/2022 Last pap: normal Date: 02/2018 OB History: G2, P2002   Menstrual History: Menarche age: 77 No LMP recorded. Patient is postmenopausal.    Past Medical History:  Diagnosis Date   Atypical angina (HCC) 08/17/2005   Exercise stress test-EF74% ,normal perfusion   Atypical nevus 10/22/2007   minimal - left cheek   Cancer (HCC) 10/2017   left breast cancer   Dysrhythmia 02/09/2009   history of SVT ; Echo normal systolic and diastolic function ,EF=>55%   Hx of colonic polyps adenomas and ssp 10/09/2017   Mitral valve prolapse    history mild MVP   Osteoporosis     Past Surgical History:  Procedure Laterality Date   BREAST BIOPSY Right 1995   COLONOSCOPY     COLONOSCOPY  11/2017   MASTECTOMY W/ SENTINEL NODE BIOPSY Left 12/06/2017   Procedure: MASTECTOMY WITH SENTINEL LYMPH NODE BIOPSY;  Surgeon: Griselda Miner, MD;  Location: [facility];  Service: General;  Laterality: Left;   POLYPECTOMY     TONSILLECTOMY      Family History  Problem Relation Age of Onset   Heart disease Father    Colon cancer Neg Hx    Esophageal cancer Neg Hx    Pancreatic cancer Neg Hx    Rectal cancer Neg Hx    Stomach cancer Neg Hx     Social History:  reports that she has never smoked. She has never used smokeless tobacco. She reports current alcohol use. She reports that she does not use drugs.  Allergies: No Known Allergies  Meds: Metoprolol XL 25mg  daily (to be held morning of surgery per cardiologist)  Review of  Systems  Constitutional: Negative.   HENT: Negative.    Eyes: Negative.   Respiratory: Negative.    Cardiovascular: Negative.   Gastrointestinal: Negative.   Endocrine: Negative.   Genitourinary:  Positive for vaginal bleeding.  Musculoskeletal: Negative.   Skin: Negative.     Height 5' 5.5" (1.664 m), weight 63.5 kg.  Constitutional General Appearance: healthy-appearing, well-nourished, well-developed Head Head: normocephalic Eyes Eye Exam: normal Ears, Nose, Throat Ears: normal Nose: no edema, no erythema, no nasal discharge Oral Cavity: general condition: good Lungs Peripheral Vascular: no varicosities, no edema Respiratory Effort: no accessory muscle usage, no intercostal retractions Extremities Legs: normal Arms: normal Skin Appearance: no obvious rashes, no obvious lesions Neurological System Impressions motor: no deficits, sensory: no deficits Psychiatric Orientation: to person, to time Mood and Affect: active and alert, normal mood, normal affect  Assessment/Plan: 77yo w/hx of breast cancer s/p tamoxifen w/PMB and endometrial polyp/cystic area of endometrium on ultrasound. Plan for Hsc, D&C to remove polyp and obtain global sampling of uterus.   Lanita Stammen A D'iorio 02/12/2023, 10:14 PM

## 2023-02-12 NOTE — Anesthesia Preprocedure Evaluation (Signed)
Anesthesia Evaluation  Patient identified by MRN, date of birth, ID band Patient awake    Reviewed: Allergy & Precautions, NPO status , Patient's Chart, lab work & pertinent test results  Airway Mallampati: II  TM Distance: >3 FB     Dental no notable dental hx. (+) Caps, Dental Advisory Given   Pulmonary neg pulmonary ROS   Pulmonary exam normal breath sounds clear to auscultation       Cardiovascular + angina  Normal cardiovascular exam+ dysrhythmias Supra Ventricular Tachycardia + Valvular Problems/Murmurs MVP  Rhythm:Regular Rate:Normal     Neuro/Psych negative neurological ROS  negative psych ROS   GI/Hepatic negative GI ROS, Neg liver ROS,,,  Endo/Other  negative endocrine ROS    Renal/GU negative Renal ROS  negative genitourinary   Musculoskeletal Osteoporosis   Abdominal   Peds  Hematology negative hematology ROS (+)   Anesthesia Other Findings   Reproductive/Obstetrics Thickened endometrium                             Anesthesia Physical Anesthesia Plan  ASA: 2  Anesthesia Plan: General   Post-op Pain Management: Minimal or no pain anticipated, Precedex and Tylenol PO (pre-op)*   Induction: Intravenous  PONV Risk Score and Plan: Treatment may vary due to age or medical condition, TIVA, Ondansetron and Dexamethasone  Airway Management Planned: LMA  Additional Equipment: None  Intra-op Plan:   Post-operative Plan: Extubation in OR  Informed Consent: I have reviewed the patients History and Physical, chart, labs and discussed the procedure including the risks, benefits and alternatives for the proposed anesthesia with the patient or authorized representative who has indicated his/her understanding and acceptance.     Dental advisory given  Plan Discussed with: CRNA and Anesthesiologist  Anesthesia Plan Comments:        Anesthesia Quick Evaluation

## 2023-02-13 ENCOUNTER — Ambulatory Visit (HOSPITAL_BASED_OUTPATIENT_CLINIC_OR_DEPARTMENT_OTHER): Payer: Self-pay | Admitting: Anesthesiology

## 2023-02-13 ENCOUNTER — Ambulatory Visit (HOSPITAL_COMMUNITY)
Admission: RE | Admit: 2023-02-13 | Discharge: 2023-02-13 | Disposition: A | Discharge status: Home / Self Care | Payer: Medicare Other | Source: Ambulatory Visit | Attending: Obstetrics | Admitting: Obstetrics

## 2023-02-13 ENCOUNTER — Encounter (HOSPITAL_BASED_OUTPATIENT_CLINIC_OR_DEPARTMENT_OTHER)
Admission: RE | Disposition: A | Discharge status: Home / Self Care | Payer: Self-pay | Source: Home / Self Care | Attending: Obstetrics

## 2023-02-13 ENCOUNTER — Other Ambulatory Visit: Payer: Self-pay

## 2023-02-13 ENCOUNTER — Ambulatory Visit (HOSPITAL_BASED_OUTPATIENT_CLINIC_OR_DEPARTMENT_OTHER): Payer: Medicare Other | Admitting: Anesthesiology

## 2023-02-13 ENCOUNTER — Encounter (HOSPITAL_BASED_OUTPATIENT_CLINIC_OR_DEPARTMENT_OTHER): Payer: Self-pay | Admitting: Obstetrics

## 2023-02-13 ENCOUNTER — Ambulatory Visit (HOSPITAL_BASED_OUTPATIENT_CLINIC_OR_DEPARTMENT_OTHER)
Admission: RE | Admit: 2023-02-13 | Discharge: 2023-02-13 | Disposition: A | Discharge status: Home / Self Care | Payer: Medicare Other | Attending: Obstetrics | Admitting: Obstetrics

## 2023-02-13 DIAGNOSIS — Z9012 Acquired absence of left breast and nipple: Secondary | ICD-10-CM | POA: Insufficient documentation

## 2023-02-13 DIAGNOSIS — Z853 Personal history of malignant neoplasm of breast: Secondary | ICD-10-CM | POA: Diagnosis not present

## 2023-02-13 DIAGNOSIS — M810 Age-related osteoporosis without current pathological fracture: Secondary | ICD-10-CM | POA: Insufficient documentation

## 2023-02-13 DIAGNOSIS — N840 Polyp of corpus uteri: Secondary | ICD-10-CM | POA: Diagnosis not present

## 2023-02-13 DIAGNOSIS — R9389 Abnormal findings on diagnostic imaging of other specified body structures: Secondary | ICD-10-CM | POA: Insufficient documentation

## 2023-02-13 DIAGNOSIS — N950 Postmenopausal bleeding: Secondary | ICD-10-CM | POA: Diagnosis not present

## 2023-02-13 DIAGNOSIS — N841 Polyp of cervix uteri: Secondary | ICD-10-CM | POA: Diagnosis not present

## 2023-02-13 HISTORY — PX: OPERATIVE ULTRASOUND: SHX5996

## 2023-02-13 HISTORY — PX: DILATATION & CURETTAGE/HYSTEROSCOPY WITH MYOSURE: SHX6511

## 2023-02-13 LAB — CBC
HCT: 42 % (ref 36.0–46.0)
Hemoglobin: 14.3 g/dL (ref 12.0–15.0)
MCH: 32.9 pg (ref 26.0–34.0)
MCHC: 34 g/dL (ref 30.0–36.0)
MCV: 96.8 fL (ref 80.0–100.0)
Platelets: 170 10*3/uL (ref 150–400)
RBC: 4.34 MIL/uL (ref 3.87–5.11)
RDW: 12.8 % (ref 11.5–15.5)
WBC: 7.2 10*3/uL (ref 4.0–10.5)
nRBC: 0 % (ref 0.0–0.2)

## 2023-02-13 LAB — BASIC METABOLIC PANEL
Anion gap: 9 (ref 5–15)
BUN: 20 mg/dL (ref 8–23)
CO2: 25 mmol/L (ref 22–32)
Calcium: 8.8 mg/dL — ABNORMAL LOW (ref 8.9–10.3)
Chloride: 105 mmol/L (ref 98–111)
Creatinine, Ser: 0.68 mg/dL (ref 0.44–1.00)
GFR, Estimated: >60 mL/min (ref >60)
Glucose, Bld: 92 mg/dL (ref 70–99)
Potassium: 4 mmol/L (ref 3.5–5.1)
Sodium: 139 mmol/L (ref 135–145)

## 2023-02-13 LAB — TYPE AND SCREEN
ABO/RH(D): O POS
Antibody Screen: NEGATIVE

## 2023-02-13 LAB — ABO/RH: ABO/RH(D): O POS

## 2023-02-13 SURGERY — DILATATION & CURETTAGE/HYSTEROSCOPY WITH MYOSURE
Anesthesia: General | Site: Uterus

## 2023-02-13 MED ORDER — EPHEDRINE SULFATE (PRESSORS) 50 MG/ML IJ SOLN
INTRAMUSCULAR | Status: DC | PRN
  Administered 2023-02-13 (×3): 5 mg via INTRAVENOUS

## 2023-02-13 MED ORDER — DEXMEDETOMIDINE HCL IN NACL 80 MCG/20ML IV SOLN
INTRAVENOUS | Status: DC | PRN
  Administered 2023-02-13: 4 ug via INTRAVENOUS

## 2023-02-13 MED ORDER — PROPOFOL 1000 MG/100ML IV EMUL
INTRAVENOUS | Status: AC
  Filled 2023-02-13: qty 100

## 2023-02-13 MED ORDER — ONDANSETRON HCL 4 MG/2ML IJ SOLN
INTRAMUSCULAR | Status: DC | PRN
  Administered 2023-02-13: 4 mg via INTRAVENOUS

## 2023-02-13 MED ORDER — KETOROLAC TROMETHAMINE 30 MG/ML IJ SOLN
INTRAMUSCULAR | Status: DC | PRN
  Administered 2023-02-13: 15 mg via INTRAVENOUS

## 2023-02-13 MED ORDER — FENTANYL CITRATE (PF) 100 MCG/2ML IJ SOLN
INTRAMUSCULAR | Status: AC
  Filled 2023-02-13: qty 2

## 2023-02-13 MED ORDER — PROPOFOL 10 MG/ML IV BOLUS
INTRAVENOUS | Status: DC | PRN
  Administered 2023-02-13: 100 mg via INTRAVENOUS

## 2023-02-13 MED ORDER — ACETAMINOPHEN 500 MG PO TABS
1000.0000 mg | ORAL_TABLET | Freq: Once | ORAL | Status: AC
  Administered 2023-02-13: 1000 mg via ORAL

## 2023-02-13 MED ORDER — SODIUM CHLORIDE 0.9 % IR SOLN
Status: DC | PRN
  Administered 2023-02-13: 3000 mL

## 2023-02-13 MED ORDER — FENTANYL CITRATE (PF) 100 MCG/2ML IJ SOLN
INTRAMUSCULAR | Status: DC | PRN
  Administered 2023-02-13: 50 ug via INTRAVENOUS

## 2023-02-13 MED ORDER — DEXAMETHASONE SODIUM PHOSPHATE 4 MG/ML IJ SOLN
INTRAMUSCULAR | Status: DC | PRN
  Administered 2023-02-13: 4 mg via INTRAVENOUS

## 2023-02-13 MED ORDER — FENTANYL CITRATE (PF) 100 MCG/2ML IJ SOLN: 25.0000 ug | INTRAMUSCULAR | Status: DC | PRN

## 2023-02-13 MED ORDER — POVIDONE-IODINE 10 % EX SWAB: 2.0000 | Freq: Once | CUTANEOUS | Status: DC

## 2023-02-13 MED ORDER — STERILE WATER FOR IRRIGATION IR SOLN
Status: DC | PRN
  Administered 2023-02-13: 500 mL

## 2023-02-13 MED ORDER — LIDOCAINE HCL (CARDIAC) PF 100 MG/5ML IV SOSY
PREFILLED_SYRINGE | INTRAVENOUS | Status: DC | PRN
  Administered 2023-02-13: 50 mg via INTRAVENOUS

## 2023-02-13 MED ORDER — PROPOFOL 500 MG/50ML IV EMUL
INTRAVENOUS | Status: DC | PRN
Start: 2023-02-13 — End: 2023-02-13
  Administered 2023-02-13: 150 ug/kg/min via INTRAVENOUS

## 2023-02-13 MED ORDER — PHENYLEPHRINE HCL (PRESSORS) 10 MG/ML IV SOLN
INTRAVENOUS | Status: DC | PRN
  Administered 2023-02-13 (×3): 80 ug via INTRAVENOUS

## 2023-02-13 MED ORDER — PROPOFOL 500 MG/50ML IV EMUL
INTRAVENOUS | Status: AC
  Filled 2023-02-13: qty 50

## 2023-02-13 MED ORDER — LACTATED RINGERS IV SOLN
INTRAVENOUS | Status: DC
Start: 2023-02-13 — End: 2023-02-13

## 2023-02-13 MED ORDER — ACETAMINOPHEN 500 MG PO TABS
ORAL_TABLET | ORAL | Status: AC
  Filled 2023-02-13: qty 2

## 2023-02-13 MED ORDER — OXYCODONE HCL 5 MG PO TABS: 5.0000 mg | ORAL_TABLET | Freq: Once | ORAL | Status: DC | PRN

## 2023-02-13 MED ORDER — OXYCODONE HCL 5 MG/5ML PO SOLN: 5.0000 mg | Freq: Once | ORAL | Status: DC | PRN

## 2023-02-13 MED ORDER — ONDANSETRON HCL 4 MG/2ML IJ SOLN: 4.0000 mg | Freq: Once | INTRAMUSCULAR | Status: DC | PRN

## 2023-02-13 SURGICAL SUPPLY — 17 items
CATH ROBINSON RED A/P 16FR (CATHETERS) ×2 IMPLANT
DEVICE MYOSURE LITE (MISCELLANEOUS) IMPLANT
DEVICE MYOSURE REACH (MISCELLANEOUS) IMPLANT
DILATOR CANAL MILEX (MISCELLANEOUS) IMPLANT
GLOVE BIO SURGEON STRL SZ7 (GLOVE) ×2 IMPLANT
GLOVE BIOGEL PI IND STRL 7.0 (GLOVE) ×2 IMPLANT
GOWN STRL REUS W/TWL LRG LVL3 (GOWN DISPOSABLE) ×2 IMPLANT
IV NS IRRIG 3000ML ARTHROMATIC (IV SOLUTION) ×2 IMPLANT
KIT PROCEDURE FLUENT (KITS) ×2 IMPLANT
KIT TURNOVER CYSTO (KITS) ×2 IMPLANT
PACK VAGINAL MINOR WOMEN LF (CUSTOM PROCEDURE TRAY) ×2 IMPLANT
PAD OB MATERNITY 4.3X12.25 (PERSONAL CARE ITEMS) ×2 IMPLANT
SEAL CERVICAL OMNI LOK (ABLATOR) IMPLANT
SEAL ROD LENS SCOPE MYOSURE (ABLATOR) ×2 IMPLANT
SLEEVE SCD COMPRESS KNEE MED (STOCKING) ×2 IMPLANT
TOWEL OR 17X24 6PK STRL BLUE (TOWEL DISPOSABLE) ×2 IMPLANT
WATER STERILE IRR 500ML POUR (IV SOLUTION) ×2 IMPLANT

## 2023-02-13 NOTE — Anesthesia Postprocedure Evaluation (Signed)
Anesthesia Post Note  Patient: [name]  Procedure(s) Performed: DILATATION & CURETTAGE/HYSTEROSCOPY WITH MYOSURE (Uterus) OPERATIVE ULTRASOUND (Abdomen)     Patient location during evaluation: PACU Anesthesia Type: General Level of consciousness: awake and alert and oriented Pain management: pain level controlled Vital Signs Assessment: post-procedure vital signs reviewed and stable Respiratory status: spontaneous breathing, nonlabored ventilation and respiratory function stable Cardiovascular status: blood pressure returned to baseline and stable Postop Assessment: no apparent nausea or vomiting Anesthetic complications: no   No notable events documented.  Last Vitals:  Vitals:   02/13/23 1045 02/13/23 1100  BP: 112/75 112/71  Pulse: 77 75  Resp: 10 11  Temp:    SpO2: 99% 98%    Last Pain:  Vitals:   02/13/23 1100  TempSrc:   PainSc: 1                  Tresha Muzio A.

## 2023-02-13 NOTE — Op Note (Signed)
Operative Report  Surgeon: Antony Salmon D'iorio, MD  Assistant: None  Preoperative Diagnosis: Endometrial polyp, postmenopausal bleeding  Postoperative Diagnosis: Same  Anesthesia: General  Implants: None  Specimen: Endometrial curetting and polyp  Drains: None  Fluids: Per anesthesia record  EBL: 5 mL   Fluid deficit: 75 mL   Complications: None  Findings: EUA: Normal external female genitalia. Normal appearing vagina and cervix.  Intra-op: Bilateral ostia visualized. Endometrium showed thin endometrium with discrete endometrial polyp on posterior uterine wall measuring 4 mm . Hemostatic at end of case.   Procedure in Detail:  The patient was seen in the preoperative holding area, consents were verified and all questions and concerns related to the proposed procedure were discussed in detail. The patient was transferred to the operating room where SCD's were placed. The patient underwent general anesthesia with LMA without complication. The patient was placed in dorsal lithotomy position and prepped and draped in usual sterile fashion.The hysteroscope was prepped and zeroed. A time out was performed with all team members.   Attention was turned to the vagina. A speculum was placed and cervix was visualized. The anterior lip of the cervix was grasped with a single-tooth tenaculum. The cervix was serially dilated to #9 pratt dilator under ultrasound guidance. The hysteroscope was passed through the cervix into the uterine cavity without difficulty with findings as above. Bilateral ostia were visualized. Myosure device was then used to remove polyp and perform global sampling of endometrium. Global view of endometrium at end of case showed no remaining abnormal tissue. A curette was used to perform a gentle curettage until gritty texture appreciated. Specimen sent to pathology with myosure curettings. All instruments were removed from the uterus and vagina. Cervix was inspected and  hemostasis ensured. All sponge and instrument counts correct x2.   Patient was awakened from anesthesia and taken to the recovery room in stable condition.   Marlene Bast, MD

## 2023-02-13 NOTE — Interval H&P Note (Signed)
History and Physical Interval Note:  02/13/2023 8:05 AM  [name]  has presented today for surgery, with the diagnosis of Thickened Endometrium.  The various methods of treatment have been discussed with the patient and family. After consideration of risks, benefits and other options for treatment, the patient has consented to  Procedure(s): DILATATION & CURETTAGE/HYSTEROSCOPY WITH MYOSURE (N/A) OPERATIVE ULTRASOUND (N/A) as a surgical intervention.  The patient's history has been reviewed, patient examined, no change in status, stable for surgery.  I have reviewed the patient's chart and labs.  Questions were answered to the patient's satisfaction.     Antony Salmon D'iorio

## 2023-02-13 NOTE — Transfer of Care (Signed)
Immediate Anesthesia Transfer of Care Note  Patient: [name]  Procedure(s) Performed: DILATATION & CURETTAGE/HYSTEROSCOPY WITH MYOSURE (Uterus) OPERATIVE ULTRASOUND (Abdomen)  Patient Location: PACU  Anesthesia Type:General  Level of Consciousness: drowsy and patient cooperative  Airway & Oxygen Therapy: Patient Spontanous Breathing and Patient connected to nasal cannula oxygen  Post-op Assessment: Report given to RN and Post -op Vital signs reviewed and stable  Post vital signs: Reviewed and stable  Last Vitals:  Vitals Value Taken Time  BP 110/71 02/13/23 1038  Temp    Pulse 79 02/13/23 1039  Resp 15 02/13/23 1039  SpO2 100 % 02/13/23 1039  Vitals shown include unfiled device data.  Last Pain:  Vitals:   02/13/23 0749  TempSrc: Oral  PainSc: 0-No pain      Patients Stated Pain Goal: 5 (02/13/23 0749)  Complications: No notable events documented.

## 2023-02-13 NOTE — Anesthesia Procedure Notes (Signed)
Procedure Name: LMA Insertion Date/Time: 02/13/2023 10:01 AM  Performed by: Earmon Phoenix, CRNAPre-anesthesia Checklist: Patient identified, Emergency Drugs available, Suction available, Patient being monitored and Timeout performed Patient Re-evaluated:Patient Re-evaluated prior to induction Oxygen Delivery Method: Circle system utilized Preoxygenation: Pre-oxygenation with 100% oxygen Induction Type: IV induction Ventilation: Mask ventilation without difficulty LMA: LMA inserted LMA Size: 4.0 Number of attempts: 1 Placement Confirmation: positive ETCO2 and breath sounds checked- equal and bilateral Tube secured with: Tape Dental Injury: Teeth and Oropharynx as per pre-operative assessment

## 2023-02-13 NOTE — Discharge Instructions (Addendum)
Post-Operative Discharge Instructions  On 02/13/23 you had a Hysteroscopy, dilation and curettage with polypectomy with Dr. Byrd Hesselbach D'Iorio. Please follow the instructions below during your recovery period. If you have questions or concerns please call our office at [phone].  Please look out for the following and call the office immediately or go to nearest Emergency Room if you experience: - Chest pain - Shortness of breath - Fever >100.4 F  - Persistent nausea and/or vomiting - Difficulty urinating or not urinating for >6 hours  - Severe abdominal or pelvic pain not responsive to recommended pain medications - Heavy vaginal bleeding (soaking >2 pads per hour)  - Pain, swelling, and/or redness in one leg   Activity: You may resume normal activity as tolerated. We recommend walking as soon and as much as you are comfortably able.  Please do not put in anything in the vagina for 2 weeks after your surgery. This includes: - No intercourse - No tampons - No douching - No sitting in water (e.g. pools, baths, etc.)   Expectations: - Light vaginal bleeding is normal after your procedure and should resolve in the next few days. - Some abdominal soreness and bloating is normal. Please see below for recommended medications.  - Feeling more tired than usual for a few days following anesthesia is normal.   Diet: You may resume normal diet immediately after surgery. Anesthesia can make you feel nauseous, we recommend avoiding spicy or acidic foods for the first 72 hours after surgery. Drink lots of water (64oz/day).   Pain: We expect you to have some abdominal/pelvic pain and cramping for a few days after your surgery. We recommend taking medication around the clock for the first 48-72 hours after your surgery as follows: - Acetaminophen (Tylenol) 1000mg  every 6 hours  - Ibuprofen (Advil or Motrin) 600mg  every 6 hours - Miralax or other stool softener to keep bowel movements soft and avoid  straining   We wish you a speedy recovery!    Post Anesthesia Home Care Instructions  Activity: Get plenty of rest for the remainder of the day. A responsible individual must stay with you for 24 hours following the procedure.  For the next 24 hours, DO NOT: -Drive a car -Advertising copywriter -Drink alcoholic beverages -Take any medication unless instructed by your physician -Make any legal decisions or sign important papers.  Meals: Start with liquid foods such as gelatin or soup. Progress to regular foods as tolerated. Avoid greasy, spicy, heavy foods. If nausea and/or vomiting occur, drink only clear liquids until the nausea and/or vomiting subsides. Call your physician if vomiting continues.  Special Instructions/Symptoms: Your throat may feel dry or sore from the anesthesia or the breathing tube placed in your throat during surgery. If this causes discomfort, gargle with warm salt water. The discomfort should disappear within 24 hours.      D & C Home care Instructions:   Personal hygiene:  Used sanitary napkins for vaginal drainage not tampons. Shower or tub bathe the day after your procedure. No douching until bleeding stops. Always wipe from front to back after  Elimination.  Activity: Do not drive or operate any equipment today. The effects of the anesthesia are still present and drowsiness may result. Rest today, not necessarily flat bed rest, just take it easy. You may resume your normal activity in one to 2 days.  Sexual activity: No intercourse for one week or as indicated by your physician  Diet: Eat a light diet as desired this  evening. You may resume a regular diet tomorrow.  Return to work: One to 2 days.  General Expectations of your surgery: Vaginal bleeding should be no heavier than a normal period. Spotting may continue up to 10 days. Mild cramps may continue for a couple of days. You may have a regular period in 2-6 weeks.  Unexpected observations call your  doctor if these occur: persistent or heavy bleeding. Severe abdominal cramping or pain. Elevation of temperature greater than 100F.  May take Tylenol beginning at 2 PM as needed for cramping/soreness. May take Ibuprofen beginning at 4:30 PM as needed for for cramping/soreness.

## 2023-02-14 LAB — SURGICAL PATHOLOGY

## 2023-02-15 ENCOUNTER — Encounter (HOSPITAL_BASED_OUTPATIENT_CLINIC_OR_DEPARTMENT_OTHER): Payer: Self-pay | Admitting: Obstetrics

## 2023-02-27 DIAGNOSIS — Z01419 Encounter for gynecological examination (general) (routine) without abnormal findings: Secondary | ICD-10-CM | POA: Diagnosis not present

## 2023-02-27 DIAGNOSIS — Z124 Encounter for screening for malignant neoplasm of cervix: Secondary | ICD-10-CM | POA: Diagnosis not present

## 2023-02-27 DIAGNOSIS — Z01411 Encounter for gynecological examination (general) (routine) with abnormal findings: Secondary | ICD-10-CM | POA: Diagnosis not present

## 2023-03-03 DIAGNOSIS — J988 Other specified respiratory disorders: Secondary | ICD-10-CM | POA: Diagnosis not present

## 2023-05-28 DIAGNOSIS — Z853 Personal history of malignant neoplasm of breast: Secondary | ICD-10-CM | POA: Diagnosis not present

## 2023-05-28 DIAGNOSIS — M818 Other osteoporosis without current pathological fracture: Secondary | ICD-10-CM | POA: Diagnosis not present

## 2023-05-28 DIAGNOSIS — Z23 Encounter for immunization: Secondary | ICD-10-CM | POA: Diagnosis not present

## 2023-05-28 DIAGNOSIS — E7800 Pure hypercholesterolemia, unspecified: Secondary | ICD-10-CM | POA: Diagnosis not present

## 2023-05-28 DIAGNOSIS — M2540 Effusion, unspecified joint: Secondary | ICD-10-CM | POA: Diagnosis not present

## 2023-05-28 DIAGNOSIS — Z1331 Encounter for screening for depression: Secondary | ICD-10-CM | POA: Diagnosis not present

## 2023-05-28 DIAGNOSIS — I340 Nonrheumatic mitral (valve) insufficiency: Secondary | ICD-10-CM | POA: Diagnosis not present

## 2023-05-28 DIAGNOSIS — Z0000 Encounter for general adult medical examination without abnormal findings: Secondary | ICD-10-CM | POA: Diagnosis not present

## 2023-05-31 ENCOUNTER — Ambulatory Visit: Payer: Medicare Other | Attending: Cardiovascular Disease | Admitting: Cardiovascular Disease

## 2023-05-31 ENCOUNTER — Encounter: Payer: Self-pay | Admitting: Cardiovascular Disease

## 2023-05-31 DIAGNOSIS — R002 Palpitations: Secondary | ICD-10-CM | POA: Diagnosis not present

## 2023-05-31 DIAGNOSIS — I341 Nonrheumatic mitral (valve) prolapse: Secondary | ICD-10-CM | POA: Insufficient documentation

## 2023-05-31 DIAGNOSIS — I471 Supraventricular tachycardia, unspecified: Secondary | ICD-10-CM | POA: Insufficient documentation

## 2023-05-31 DIAGNOSIS — Z853 Personal history of malignant neoplasm of breast: Secondary | ICD-10-CM | POA: Diagnosis not present

## 2023-05-31 DIAGNOSIS — N939 Abnormal uterine and vaginal bleeding, unspecified: Secondary | ICD-10-CM | POA: Diagnosis not present

## 2023-05-31 NOTE — Progress Notes (Signed)
 Patient ID: [name], female   DOB: 1945/05/27, 78 y.o.   MRN: [id number]        Primary M.D.: Dr. Merri Brunette  HPI: [name] is a 78 y.o. female who presents for a 12 month follow-up cardiology evaluation.  [name] has a history of SVT and has been treated with beta blocker therapy with Toprol-XL 37.5 mg daily.  An echo Doppler study in 2010 showed normal systolic function.  She had mild mitral regurgitation.  She has remained fairly active.  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    When I her in February 2016 and at that time she was complaining of difficulty with waking up at night. She sleeps on her back and cannot sleep on her side since she notices palpitations.  She does snore. Remotely she had a sleep study. She also has noticed some development of left-sided chest discomfort.  She denies significant shortness of breath.  She denies presyncope or syncope.  She was very concerned about these palpitations and some episodes of chest discomfort.  She  underwent an echo Doppler study on 05/27/2014 which showed an ejection fraction at 60-65%.  There was grade 1 diastolic dysfunction.  She had documented late systolic bileaflet mitral valve prolapse with trivial mitral regurgitation.  There was mild tricuspid regurgitation.  Estimated PA pressures were normal at 22 mm.  She underwent a nuclear perfusion study which was normal.  She had excellent exercise capacity and exercise for 10 minutes in the Bruce protocol without chest pain or ECG changes.  Scintigraphic images were normal.   I recommended that he change the way she was taking her metoprolol succinate and recommended that she take 25 mg in the morning and 12.5 mg at night.  She feels that this has dramatically improved her symptomatic palpitations nocturnally.  She denies daytime fatigue.    I saw her in May 2018 at which time she was doing well and denied any chest pain or palpitations.  Her palpitations which  were occurring nocturnally significantly improved with the addition of a 12.5 mg Toprol dose at night in addition to her 25 mg in the morning.   I saw her in July 2019 at which time she was doing well and was only experiencing very short-lived palpitations which resolved spontaneously.  She did not have any chest pain and denied any change in exercise tolerance.   She was diagnosed with breast cancer and is being followed by Dr. Darnelle Catalan.  She was found to have estrogen receptor positive ductal carcinoma in situ and is status post left mastectomy.  She currently is being treated with tamoxifen.  She tolerated her surgery well.    I saw her in October 2020 at which time she was doing well and was without chest pain.  She was walking 1 to 1-1/2 miles per day without chest pain.  She was unaware of recent palpitations.    She was evaluated by Edd Fabian, NP in December 2021 when she presented with chest pain.  These were not exertionally precipitated to be nonischemic in etiology. When I saw her in January 2022 the symptoms had subsided.  She was taking Toprol 25 mg in the morning and 12.5 mg at night which also resolved her nocturnal palpitations.  She continues to be on aspirin and was on tamoxifen with her breast CVA history.    When I saw her on May 25, 2021  she remained stable cardiovascularly.  CT  showed sinus rhythm at 70 without ectopy.  Blood pressure was stable without significant orthostatic drop.  I last saw her on May 30, 2022.  She is to undergo colonoscopy next week with Dr. Leone Payor and was concerned about her bowel prep.  She is now on Toprol-XL 37.5 mg daily.  Dr. Darnelle Catalan has retired and she now sees Dr. Burnice Logan Iruku with her history of remote breast cancer and she tells me he will be 5 years on tamoxifen in September at which time this may be discontinued.    Since I last saw her, this had developed some utero bleeding after tamoxifen was stopped due to thickened uterus.   Ultimately this subsided.  She denies chest pain shortness of breath or palpitations.  She sees Dr. Merri Brunette at [facility] for primary care who checks laboratory.  She was told that she had mild bilirubin elevation.  She is unaware of any recurrent tachypalpitations.  She continues to be on Toprol-XL 37.5 mg daily and takes Tums and vitamin D daily.  She presents for evaluation.   Past Medical History:  Diagnosis Date   Atypical angina (HCC) 08/17/2005   Exercise stress test-EF74% ,normal perfusion   Atypical nevus 10/22/2007   minimal - left cheek   Cancer (HCC) 10/2017   left breast cancer   Dysrhythmia 02/09/2009   history of SVT ; Echo normal systolic and diastolic function ,EF=>55%   Hx of colonic polyps adenomas and ssp 10/09/2017   Mitral valve prolapse    history mild MVP   Osteoporosis     Past Surgical History:  Procedure Laterality Date   BREAST BIOPSY Right 1995   COLONOSCOPY     COLONOSCOPY  11/2017   DILATATION & CURETTAGE/HYSTEROSCOPY WITH MYOSURE N/A 02/13/2023   Procedure: DILATATION & CURETTAGE/HYSTEROSCOPY WITH MYOSURE;  Surgeon: D'Iorio, Antony Salmon, MD;  Location: [facility];  Service: Gynecology;  Laterality: N/A;   MASTECTOMY W/ SENTINEL NODE BIOPSY Left 12/06/2017   Procedure: MASTECTOMY WITH SENTINEL LYMPH NODE BIOPSY;  Surgeon: Griselda Miner, MD;  Location: [facility];  Service: General;  Laterality: Left;   OPERATIVE ULTRASOUND N/A 02/13/2023   Procedure: OPERATIVE ULTRASOUND;  Surgeon: D'Iorio, Antony Salmon, MD;  Location: [facility];  Service: Gynecology;  Laterality: N/A;   POLYPECTOMY     TONSILLECTOMY      No Known Allergies  Current Outpatient Medications  Medication Sig Dispense Refill   calcium elemental as carbonate (BARIATRIC TUMS ULTRA) 400 MG chewable tablet Chew 1 tablet by mouth daily.     Calcium-Vitamin D-Vitamin K (VIACTIV PO) Take by mouth.     cholecalciferol (VITAMIN D) 1000 UNITS tablet Take  1,000 Units by mouth daily.     Multiple Vitamin (MULTI-VITAMIN PO) Take by mouth.     TOPROL XL 25 MG 24 hr tablet TAKE 1 &1/2 TABS BY MOUTH DAILY 135 tablet 3   No current facility-administered medications for this visit.    Social History   Socioeconomic History   Marital status: Married    Spouse name: Not on file   Number of children: 2   Years of education: Not on file   Highest education level: Not on file  Occupational History   Not on file  Tobacco Use   Smoking status: Never   Smokeless tobacco: Never  Vaping Use   Vaping status: Never Used  Substance and Sexual Activity   Alcohol use: Yes    Comment: occas glass of wine  Drug use: Never   Sexual activity: Not on file  Other Topics Concern   Not on file  Social History Narrative   Not on file   Social Drivers of Health   Financial Resource Strain: Not on file  Food Insecurity: Not on file  Transportation Needs: Not on file  Physical Activity: Not on file  Stress: Not on file  Social Connections: Not on file  Intimate Partner Violence: Not on file   Socially, 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  She is married, has 2 children.  She does exercise fairly regularly.  She drinks occasional alcohol.  No tobacco use.  Family History  Problem Relation Age of Onset   Heart disease Father    Colon cancer Neg Hx    Esophageal cancer Neg Hx    Pancreatic cancer Neg Hx    Rectal cancer Neg Hx    Stomach cancer Neg Hx     ROS General: Negative; No fevers, chills, or night sweats;  HEENT:  Positive with the complaint that her "nose runs all the time"  No changes in vision or hearing, difficulty swallowing Pulmonary: Negative; No cough, wheezing, shortness of breath, hemoptysis Cardiovascular:   See history of present illness GI: Negative; No nausea, vomiting, diarrhea, or abdominal pain GU: Negative; No dysuria, hematuria, or difficulty voiding Musculoskeletal: Negative; no myalgias, joint  pain, or weakness Hematologic/Oncology: Positive for ductal carcinoma in situ, intermediate to high-grade, estrogen receptor and progesterone receptor positive Endocrine: Negative; no heat/cold intolerance; no diabetes Neuro: Negative; no changes in balance, headaches Skin: Negative; No rashes or skin lesions Psychiatric: Negative; No behavioral problems, depression Sleep: Negative; No snoring, daytime sleepiness, hypersomnolence, bruxism, restless legs, hypnogognic hallucinations, no cataplexy Other comprehensive 14 point system review is negative.   PE:  BP 116/81   Pulse 69   Ht 5\' 5"  (1.651 m)   Wt 145 lb (65.8 kg)   SpO2 99%   BMI 24.13 kg/m    Repeat blood pressure by me was 126/70  Wt Readings from Last 3 Encounters:  05/31/23 145 lb (65.8 kg)  02/13/23 142 lb 12.8 oz (64.8 kg)  12/06/22 140 lb 9.6 oz (63.8 kg)   General: Alert, oriented, no distress.  Skin: normal turgor, no rashes, warm and dry HEENT: Normocephalic, atraumatic. Pupils equal round and reactive to light; sclera anicteric; extraocular muscles intact;  Nose without nasal septal hypertrophy Mouth/Parynx benign; Mallinpatti scale 2 Neck: No JVD, no carotid bruits; normal carotid upstroke Lungs: clear to ausculatation and percussion; no wheezing or rales Chest wall: without tenderness to palpitation Heart: PMI not displaced, RRR, s1 s2 normal, 1/6 systolic murmur, no diastolic murmur, no rubs, gallops, thrills, or heaves Abdomen: soft, nontender; no hepatosplenomehaly, BS+; abdominal aorta nontender and not dilated by palpation. Back: no CVA tenderness Pulses 2+ Musculoskeletal: full range of motion, normal strength, no joint deformities Extremities: no clubbing cyanosis or edema, Homan's sign negative  Neurologic: grossly nonfocal; Cranial nerves grossly wnl Psychologic: Normal mood and affect  EKG Interpretation Date/Time:  Friday May 31 2023 10:24:56 EDT Ventricular Rate:  69 PR  Interval:  164 QRS Duration:  82 QT Interval:  400 QTC Calculation: 428 R Axis:   102  Text Interpretation: Normal sinus rhythm Rightward axis When compared with ECG of 27-Oct-2001 17:31, Nonspecific T wave abnormality has replaced inverted T waves in Anterior leads Confirmed by Nicki Guadalajara ([id number]) on 06/02/2023 3:04:20 PM    May 30, 2022  ECG (independently read by me): NSR  at 72, no ectopy  May 25, 2021 ECG (independently read by me):  NSR at 70, no ectopy  April 06, 2020 ECG (independently read by me): NSR at 63, possible LAE, no ectopy, normal intervals  October 2020 ECG (independently read by me): NSR at 61; normal intervals;no ectopy  July 2019 ECG (independently read by me): Normal sinus rhythm with one isolated PVC.  Heart rate 69 bpm.  Normal intervals.  May 2018 ECG (independently read by me): Normal sinus rhythm 68 bpm.  UTC.  Interval 423 ms.  PR interval 160 ms.  April 2017 ECG (independently read by me): Normal sinus rhythm at 65.  No ectopy.  Normal intervals.  QTC 436 ms.  05/05/2014 ECG (independently read by me):  Normal sinus rhythm at 75 bpm. No significant ST segment changes.  Prior ECG (independently read by me): Sinus rhythm at 59 beats per minute.  Nondiagnostic T changes V1, V2.  Normal intervals.  LABS:     Latest Ref Rng & Units 02/13/2023    7:34 AM 11/27/2018   11:07 AM 11/06/2017   12:22 PM  BMP  Glucose 70 - 99 mg/dL 92  82  161   BUN 8 - 23 mg/dL 20  18  21    Creatinine 0.44 - 1.00 mg/dL 0.96  0.45  4.09   Sodium 135 - 145 mmol/L 139  141  140   Potassium 3.5 - 5.1 mmol/L 4.0  4.2  4.7   Chloride 98 - 111 mmol/L 105  105  103   CO2 22 - 32 mmol/L 25  30  29    Calcium 8.9 - 10.3 mg/dL 8.8  9.2  9.9        Latest Ref Rng & Units 11/27/2018   11:07 AM 11/06/2017   12:22 PM 05/10/2014    8:49 AM  Hepatic Function  Total Protein 6.5 - 8.1 g/dL 6.7  7.0  6.7   Albumin 3.5 - 5.0 g/dL 3.8  4.0  4.2   AST 15 - 41 U/L 20  19  18    ALT 0 - 44  U/L 14  14  14    Alk Phosphatase 38 - 126 U/L 72  110  103   Total Bilirubin 0.3 - 1.2 mg/dL 1.3  2.0  2.2        Latest Ref Rng & Units 02/13/2023    7:34 AM 11/27/2018   11:07 AM 11/06/2017   12:22 PM  CBC  WBC 4.0 - 10.5 K/uL 7.2  7.9  8.1   Hemoglobin 12.0 - 15.0 g/dL 81.1  91.4  78.2   Hematocrit 36.0 - 46.0 % 42.0  42.9  44.9   Platelets 150 - 400 K/uL 170  168  165    Lab Results  Component Value Date   MCV 96.8 02/13/2023   MCV 96.0 11/27/2018   MCV 95.7 11/06/2017   Lab Results  Component Value Date   TSH 1.626 05/10/2014  No results found for: "HGBA1C"  Lipid Panel     Component Value Date/Time   CHOL 189 05/10/2014 0849   TRIG 57 05/10/2014 0849   HDL 75 05/10/2014 0849   CHOLHDL 2.5 05/10/2014 0849   VLDL 11 05/10/2014 0849   LDLCALC 103 (H) 05/10/2014 0849    IMPRESSION:  1. Mitral valve prolapse   2. Paroxysmal supraventricular tachycardia (HCC)   3. History of breast cancer: Status post left mastectomy   4. Palpitations   5. Uterine bleeding  ASSESSMENT AND PLAN: [name] is a 78 year-old female with previously documented supraventricular tachycardia which has been treated successfully with beta-blocker therapy.  An echo Doppler study has confirmed bileaflet late systolic mitral valve prolapse.  She has multiple systolic clicks on exam.  She has documented normal systolic function with mild Grade I  diastolic relaxation abnormality.  A prior nuclear perfusion study has demonstrated good exercise tolerance with normal perfusion.  She had developed atypical chest pain leading to her presentation with Edd Fabian, NP in December 2021.  She has been on a medical regimen of Toprol-XL 25 mg in the morning and 12.5 mg at night.  She was on tamoxifen with her history of remote breast CVA.  She had developed nocturnal palpitations several months ago which occurred in the setting of increased stress.  She did take an extra half of Toprol that day with  benefit.  Her symptoms have subsequently resolved and she is unaware of recurrence.  She had been followed by Dr. Darnelle Catalan for her breast CA. who has retired and she now sees Dr. Burnice Logan Iruku.  Over the past year, she was taken off tamoxifen.  She did experience some uterine bleeding after tamoxifen was discontinued due to thickened uterus.  Presently, she denies recent chest pain, shortness of breath or palpitations.  She has continued to be on metoprolol XL 37.5 mg daily.  Her blood pressure today is stable and on repeat by me was 126/70.  Her EKG is normal demonstrating normal sinus rhythm at 69 bpm.  Dr. Katrinka Blazing has been following up her laboratory and apparently she did have mild bilirubin elevation.  I discussed with her my retirement in several months.  Clinically she is stable from a cardiovascular standpoint.  I will transition her to the care of Dr. Epifanio Lesches and will arrange a 1 year follow-up evaluation or sooner as needed.   Lennette Bihari, MD, [facility]  06/02/2023 3:08 PM

## 2023-05-31 NOTE — Patient Instructions (Signed)
 Medication Instructions:  No medication changes were made during today's visit.  *If you need a refill on your cardiac medications before your next appointment, please call your pharmacy*   Lab Work: No labs were ordered during today's visit.  If you have labs (blood work) drawn today and your tests are completely normal, you will receive your results only by: MyChart Message (if you have MyChart) OR A paper copy in the mail If you have any lab test that is abnormal or we need to change your treatment, we will call you to review the results.   Testing/Procedures: No procedures were ordered during today's visit.    Follow-Up: At [facility], you and your health needs are our priority.  As part of our continuing mission to provide you with exceptional heart care, we have created designated Provider Care Teams.  These Care Teams include your primary Cardiologist (physician) and Advanced Practice Providers (APPs -  Physician Assistants and Nurse Practitioners) who all work together to provide you with the care you need, when you need it.  We recommend signing up for the patient portal called "MyChart".  Sign up information is provided on this After Visit Summary.  MyChart is used to connect with patients for Virtual Visits (Telemedicine).  Patients are able to view lab/test results, encounter notes, upcoming appointments, etc.  Non-urgent messages can be sent to your provider as well.   To learn more about what you can do with MyChart, go to [email or web address].    Your next appointment:   1 year(s)  Provider:   Dr. Epifanio Lesches     Other Instructions [facility]  [address] [place], [place] [address] OPENING APRIL [phone]       1st Floor: - Lobby - Registration  - Pharmacy  - Lab - Cafe   2nd Floor: - PV Lab - Diagnostic Testing (echo, CT, nuclear med)   3rd Floor: - Vacant   4th Floor: - TCTS (cardiothoracic  surgery) - AFib Clinic - Structural Heart Clinic - Vascular Surgery  - Vascular Ultrasound   5th Floor: - HeartCare Cardiology (general and EP) - Clinical Pharmacy for coumadin, hypertension, lipid, weight-loss medications, and med management appointments      Valet parking services will be available as well.       Thank you for choosing [facility]!     A letter will be mailed to you as a reminder to call the office for your next follow up appointment.

## 2023-06-02 ENCOUNTER — Encounter: Payer: Self-pay | Admitting: Cardiovascular Disease

## 2023-08-17 ENCOUNTER — Other Ambulatory Visit: Payer: Self-pay | Admitting: Cardiovascular Disease

## 2023-09-30 DIAGNOSIS — D3132 Benign neoplasm of left choroid: Secondary | ICD-10-CM | POA: Diagnosis not present

## 2023-09-30 DIAGNOSIS — H25813 Combined forms of age-related cataract, bilateral: Secondary | ICD-10-CM | POA: Diagnosis not present

## 2023-11-19 ENCOUNTER — Emergency Department (HOSPITAL_BASED_OUTPATIENT_CLINIC_OR_DEPARTMENT_OTHER)
Admission: EM | Admit: 2023-11-19 | Discharge: 2023-11-19 | Disposition: A | Discharge status: Home / Self Care | Attending: Emergency Medicine | Admitting: Emergency Medicine

## 2023-11-19 ENCOUNTER — Other Ambulatory Visit: Payer: Self-pay

## 2023-11-19 ENCOUNTER — Emergency Department (HOSPITAL_BASED_OUTPATIENT_CLINIC_OR_DEPARTMENT_OTHER)

## 2023-11-19 ENCOUNTER — Encounter (HOSPITAL_BASED_OUTPATIENT_CLINIC_OR_DEPARTMENT_OTHER): Payer: Self-pay | Admitting: Emergency Medicine

## 2023-11-19 DIAGNOSIS — R0789 Other chest pain: Secondary | ICD-10-CM | POA: Diagnosis present

## 2023-11-19 DIAGNOSIS — R002 Palpitations: Secondary | ICD-10-CM | POA: Insufficient documentation

## 2023-11-19 LAB — BASIC METABOLIC PANEL WITH GFR
Anion gap: 14 (ref 5–15)
BUN: 20 mg/dL (ref 8–23)
CO2: 22 mmol/L (ref 22–32)
Calcium: 9.9 mg/dL (ref 8.9–10.3)
Chloride: 102 mmol/L (ref 98–111)
Creatinine, Ser: 0.92 mg/dL (ref 0.44–1.00)
GFR, Estimated: >60 mL/min (ref >60)
Glucose, Bld: 93 mg/dL (ref 70–99)
Potassium: 4.1 mmol/L (ref 3.5–5.1)
Sodium: 137 mmol/L (ref 135–145)

## 2023-11-19 LAB — MAGNESIUM: Magnesium: 2 mg/dL (ref 1.7–2.4)

## 2023-11-19 LAB — RESP PANEL BY RT-PCR (RSV, FLU A&B, COVID)  RVPGX2
Influenza A by PCR: NEGATIVE
Influenza B by PCR: NEGATIVE
Resp Syncytial Virus by PCR: NEGATIVE
SARS Coronavirus 2 by RT PCR: NEGATIVE

## 2023-11-19 LAB — CBC
HCT: 43.7 % (ref 36.0–46.0)
Hemoglobin: 14.9 g/dL (ref 12.0–15.0)
MCH: 31.8 pg (ref 26.0–34.0)
MCHC: 34.1 g/dL (ref 30.0–36.0)
MCV: 93.2 fL (ref 80.0–100.0)
Platelets: 175 K/uL (ref 150–400)
RBC: 4.69 MIL/uL (ref 3.87–5.11)
RDW: 13 % (ref 11.5–15.5)
WBC: 8.7 K/uL (ref 4.0–10.5)
nRBC: 0 % (ref 0.0–0.2)

## 2023-11-19 LAB — D-DIMER, QUANTITATIVE: D-Dimer, Quant: <0.27 ug{FEU}/mL (ref 0.00–0.50)

## 2023-11-19 LAB — TROPONIN T, HIGH SENSITIVITY
Troponin T High Sensitivity: <15 ng/L (ref 0–19)
Troponin T High Sensitivity: <15 ng/L (ref 0–19)

## 2023-11-19 LAB — T4, FREE: Free T4: 0.99 ng/dL (ref 0.61–1.12)

## 2023-11-19 LAB — TSH: TSH: 1.81 u[IU]/mL (ref 0.350–4.500)

## 2023-11-19 NOTE — ED Notes (Signed)
Discharge instructions and follow up care reviewed and explained, pt verbalized understanding. 

## 2023-11-19 NOTE — ED Triage Notes (Signed)
 Pt endorses twinges in my heart this am. Pt unable to explain twinge. Also reports chills and sweating

## 2023-11-19 NOTE — ED Provider Notes (Signed)
 [place] EMERGENCY DEPARTMENT AT [facility] Provider Note   CSN: [id number] Arrival date & time: 11/19/23  0750     Patient presents with: Palpitations   [name] is a 78 y.o. female.    Palpitations    78 year old female with medical history significant for atypical angina in 2007, mild mitral valve prolapse, osteoporosis who presents to the emergency department with a chief complaint of chest twinges.  The patient states that she has had several episodes this morning, including while she has been on cardiac telemetry since being bedded here in the ED.  She denies any radiation, no severe ripping tearing pain, no radiation to the back.  She had some palpitations associated with this.  On my evaluation she is currently asymptomatic, no lower extremity swelling, no shortness of breath or cough.  She did have some chills and sweating briefly.  No fevers.  She inquires about the ability to obtain COVID vaccination.  Prior to Admission medications   Medication Sig Start Date End Date Taking? Authorizing Provider  calcium elemental as carbonate (BARIATRIC TUMS ULTRA) 400 MG chewable tablet Chew 1 tablet by mouth daily.    [provider]  Calcium-Vitamin D-Vitamin K (VIACTIV PO) Take by mouth.    [provider]  cholecalciferol (VITAMIN D) 1000 UNITS tablet Take 1,000 Units by mouth daily.    [provider]  Multiple Vitamin (MULTI-VITAMIN PO) Take by mouth.    [provider]  TOPROL  XL 25 MG 24 hr tablet TAKE 1 &1/2 TABLETS BY MOUTH DAILY 08/19/23   Burnard Debby LABOR, MD    Allergies: Patient has no known allergies.    Review of Systems  Cardiovascular:  Positive for palpitations.  All other systems reviewed and are negative.   Updated Vital Signs BP 116/71   Pulse 64   Temp (!) 97.3 F (36.3 C) (Tympanic)   Resp 12   Wt 65.8 kg   SpO2 99%   BMI 24.13 kg/m   Physical Exam Vitals and nursing note reviewed.   Constitutional:      General: She is not in acute distress.    Appearance: She is well-developed.  HENT:     Head: Normocephalic and atraumatic.  Eyes:     Conjunctiva/sclera: Conjunctivae normal.  Cardiovascular:     Rate and Rhythm: Normal rate and regular rhythm.     Pulses: Normal pulses.     Heart sounds: No murmur heard. Pulmonary:     Effort: Pulmonary effort is normal. No respiratory distress.     Breath sounds: Normal breath sounds.  Chest:     Comments: Left chest wall tenderness, no rash Abdominal:     Palpations: Abdomen is soft.     Tenderness: There is no abdominal tenderness.  Musculoskeletal:        General: No swelling.     Cervical back: Neck supple.  Skin:    General: Skin is warm and dry.     Capillary Refill: Capillary refill takes less than 2 seconds.  Neurological:     General: No focal deficit present.     Mental Status: She is alert and oriented to person, place, and time. Mental status is at baseline.     Cranial Nerves: No cranial nerve deficit.     Sensory: No sensory deficit.     Motor: No weakness.  Psychiatric:        Mood and Affect: Mood normal.     (all labs ordered are listed, but  only abnormal results are displayed) Labs Reviewed  RESP PANEL BY RT-PCR (RSV, FLU A&B, COVID)  [id number]  BASIC METABOLIC PANEL WITH GFR  CBC  D-DIMER, QUANTITATIVE  MAGNESIUM  TSH  T4, FREE  TROPONIN T, HIGH SENSITIVITY  TROPONIN T, HIGH SENSITIVITY    EKG: EKG Interpretation Date/Time:  Tuesday November 19 2023 08:00:51 EDT Ventricular Rate:  61 PR Interval:  186 QRS Duration:  92 QT Interval:  421 QTC Calculation: 424 R Axis:   90  Text Interpretation: Sinus rhythm Borderline right axis deviation Nonspecific T abnrm, anterolateral leads Confirmed by Jerrol Agent ([id number]) on 11/19/2023 8:24:48 AM  Radiology: [facility] Chest Portable 1 View Result Date: 11/19/2023 CLINICAL DATA:  Chest pain EXAM: PORTABLE CHEST 1 VIEW COMPARISON:  None Available.  FINDINGS: Previous left mastectomy and lymph node dissection. The heart size is normal.  The lungs are clear.  No pleural disease. IMPRESSION: No active disease. Electronically Signed   By: Nancyann Burns M.D.   On: 11/19/2023 10:57     Procedures   Medications Ordered in the ED - No data to display                                  Medical Decision Making Amount and/or Complexity of Data Reviewed Labs: ordered. Radiology: ordered.     78 year old female with medical history significant for atypical angina in 2007, mild mitral valve prolapse, osteoporosis who presents to the emergency department with a chief complaint of chest twinges.  The patient states that she has had several episodes this morning, including while she has been on cardiac telemetry since being bedded here in the ED.  She denies any radiation, no severe ripping tearing pain, no radiation to the back.  She had some palpitations associated with this.  On my evaluation she is currently asymptomatic, no lower extremity swelling, no shortness of breath or cough.  She did have some chills and sweating briefly.  No fevers.  She inquires about the ability to obtain COVID vaccination.  On arrival, patient was afebrile, not tachycardic or tachypneic, BP 136/91, saturating 99% on room air.  On exam the patient had a normal neurologic exam, lungs clear to auscultation bilaterally.  She did have mild left-sided chest wall tenderness to palpation.  No rash was appreciated.  No significant murmur appreciated on exam.  Differential diagnose includes ACS, PE, pneumothorax, pneumonia, viral infection, chest wall discomfort, less likely shingles with no rash appreciated on exam.  EKG: Sinus rhythm, ventricular rate 61, no acute ST changes, no significant changes compared to prior EKGs, no abnormal intervals.  Cardiac telemetry was reviewed, no abnormal findings noted while the patient has been on cardiac telemetry.  Chest x-ray: No active  disease, no evidence of pneumothorax or pneumonia.  Labs: TSH normal, cardiac troponins x 2 negative, COVID flu and RSV PCR testing negative, D-dimer negative, magnesium normal, BMP unremarkable, CBC normal  Patient was asymptomatic on repeat assessment, discussed with her ability to get COVID vaccination through [facility] pharmacy given age greater than 65 per recent guideline changes, should not be a problem to obtain vaccination if she would like that done outpatient.  No significant murmur appreciated on exam, low concern for valvular abnormality or any other acute intrathoracic abnormality.  D-dimer normal, low concern for PE and low concern for ACS with normal troponins x 2.  Chest x-ray unremarkable.  Advised that she follow-up with her PCP  regarding this.  Patient is overall asymptomatic, with some chest wall discomfort, advised short course of NSAIDs for pain control as needed, outpatient follow-up.  Patient stable for discharge     Final diagnoses:  Palpitations  Chest wall pain    ED Discharge Orders     None          Jerrol Agent, MD 11/19/23 1206

## 2023-11-19 NOTE — Discharge Instructions (Addendum)
 Your workup to include EKG, chest x-ray and laboratory evaluation was overall reassuring.  You did have some reproducible chest wall tenderness to palpation, recommend NSAIDs for short course for pain control as well as over-the-counter lidocaine  patch, return for any severe chest pain, uncontrolled heart palpitations.  You were monitored on cardiac telemetry and had several episodes of twinges as you had described and while on cardiac telemetry had no significant abnormalities noted.  This is a reassuring finding.  Please follow-up with your primary care provider

## 2023-11-20 ENCOUNTER — Encounter (HOSPITAL_BASED_OUTPATIENT_CLINIC_OR_DEPARTMENT_OTHER): Payer: Self-pay | Admitting: Cardiology

## 2023-11-20 ENCOUNTER — Ambulatory Visit: Attending: Cardiology

## 2023-11-20 ENCOUNTER — Ambulatory Visit (INDEPENDENT_AMBULATORY_CARE_PROVIDER_SITE_OTHER): Admitting: Cardiology

## 2023-11-20 VITALS — BP 116/70 | HR 66 | Resp 17 | Ht 65.0 in | Wt 145.0 lb

## 2023-11-20 DIAGNOSIS — I471 Supraventricular tachycardia, unspecified: Secondary | ICD-10-CM | POA: Diagnosis not present

## 2023-11-20 DIAGNOSIS — R55 Syncope and collapse: Secondary | ICD-10-CM | POA: Diagnosis not present

## 2023-11-20 DIAGNOSIS — I341 Nonrheumatic mitral (valve) prolapse: Secondary | ICD-10-CM

## 2023-11-20 DIAGNOSIS — R079 Chest pain, unspecified: Secondary | ICD-10-CM

## 2023-11-20 NOTE — Patient Instructions (Addendum)
 Medication Instructions:  Your physician recommends that you continue on your current medications as directed. Please refer to the Current Medication list given to you today. *If you need a refill on your cardiac medications before your next appointment, please call your pharmacy*  Lab Work: NONE  Testing/Procedures: Your physician has requested that you have an echocardiogram. Echocardiography is a painless test that uses sound waves to create images of your heart. It provides your doctor with information about the size and shape of your heart and how well your heart's chambers and valves are working. This procedure takes approximately one hour. There are no restrictions for this procedure. Please do NOT wear cologne, perfume, aftershave, or lotions (deodorant is allowed). Please arrive 15 minutes prior to your appointment time.  Please note: We ask at that you not bring children with you during ultrasound (echo/ vascular) testing. Due to room size and safety concerns, children are not allowed in the ultrasound rooms during exams. Our front office staff cannot provide observation of children in our lobby area while testing is being conducted. An adult accompanying a patient to their appointment will only be allowed in the ultrasound room at the discretion of the ultrasound technician under special circumstances. We apologize for any inconvenience.  12/13/2023 3:30 pm    2 WEEK ZIO-THIS WILL BE MAILED TO YOU   Follow-Up: At [facility], you and your health needs are our priority.  As part of our continuing mission to provide you with exceptional heart care, our providers are all part of one team.  This team includes your primary Cardiologist (physician) and Advanced Practice Providers or APPs (Physician Assistants and Nurse Practitioners) who all work together to provide you with the care you need, when you need it.  Your next appointment:   6 month(s) DR [facility] OR APP   We  recommend signing up for the patient portal called MyChart.  Sign up information is provided on this After Visit Summary.  MyChart is used to connect with patients for Virtual Visits (Telemedicine).  Patients are able to view lab/test results, encounter notes, upcoming appointments, etc.  Non-urgent messages can be sent to your provider as well.   To learn more about what you can do with MyChart, go to [email or web address].   Other Instructions ZIO XT- Long Term Monitor Instructions  Your physician has requested you wear a ZIO patch monitor for 14 days.  This is a single patch monitor. [employer] supplies one patch monitor per enrollment. Additional stickers are not available. Please do not apply patch if you will be having a Nuclear Stress Test,  Echocardiogram, Cardiac CT, MRI, or Chest Xray during the period you would be wearing the  monitor. The patch cannot be worn during these tests. You cannot remove and re-apply the  ZIO XT patch monitor.  Your ZIO patch monitor will be mailed 3 day USPS to your address on file. It may take 3-5 days  to receive your monitor after you have been enrolled.  Once you have received your monitor, please review the enclosed instructions. Your monitor  has already been registered assigning a specific monitor serial # to you.  Billing and Patient Assistance Program Information  We have supplied [employer] with any of your insurance information on file for billing purposes. [employer] offers a sliding scale Patient Assistance Program for patients that do not have  insurance, or whose insurance does not completely cover the cost of the ZIO monitor.  You must apply for the Patient  Assistance Program to qualify for this discounted rate.  To apply, please call [employer] at [phone], select option 4, select option 2, ask to apply for  Patient Assistance Program. Meredeth will ask your household income, and how many people  are in your household. They will quote your  out-of-pocket cost based on that information.  [employer] will also be able to set up a 34-month, interest-free payment plan if needed.  Applying the monitor   Shave hair from upper left chest.  Hold abrader disc by orange tab. Rub abrader in 40 strokes over the upper left chest as  indicated in your monitor instructions.  Clean area with 4 enclosed alcohol pads. Let dry.  Apply patch as indicated in monitor instructions. Patch will be placed under collarbone on left  side of chest with arrow pointing upward.  Rub patch adhesive wings for 2 minutes. Remove white label marked 1. Remove the white  label marked 2. Rub patch adhesive wings for 2 additional minutes.  While looking in a mirror, press and release button in center of patch. A small green light will  flash 3-4 times. This will be your only indicator that the monitor has been turned on.  Do not shower for the first 24 hours. You may shower after the first 24 hours.  Press the button if you feel a symptom. You will hear a small click. Record Date, Time and  Symptom in the Patient Logbook.  When you are ready to remove the patch, follow instructions on the last 2 pages of Patient  Logbook. Stick patch monitor onto the last page of Patient Logbook.  Place Patient Logbook in the blue and white box. Use locking tab on box and tape box closed  securely. The blue and white box has prepaid postage on it. Please place it in the mailbox as  soon as possible. Your physician should have your test results approximately 7 days after the  monitor has been mailed back to [facility].  Call [facility] Customer Care at [phone] if you have questions regarding  your ZIO XT patch monitor. Call them immediately if you see an orange light blinking on your  monitor.  If your monitor falls off in less than 4 days, contact our Monitor department at [phone].  If your monitor becomes loose or falls off after 4 days call [employer] at  [phone] for  suggestions on securing your monitor

## 2023-11-20 NOTE — Progress Notes (Unsigned)
 Enrolled patient for a 14 day Zio XT  monitor to be mailed to patients home

## 2023-11-20 NOTE — Progress Notes (Signed)
 Cardiology Office Note:    Date:  11/20/2023   ID:  Norvel CHRISTELLA Cera, DOB 11-14-1945, MRN [id number]  PCP:  Claudene Pellet, MD  Cardiologist:  Debby Sor, MD (Inactive)  Electrophysiologist:  None   Referring MD: Claudene Pellet, MD   Chief Complaint  Patient presents with   Palpitations   Chest Pain   Hospitalization Follow-up    History of Present Illness:    [name] is a 78 y.o. female with a hx of SVT, breast cancer, mitral valve prolapse who presents for follow-up.  Previously followed with Dr. Sor.  She was seen in ED yesterday for palpitations.  Workup unremarkable.  She reported that yesterday she was having twinges in her chest, which she describes as left-sided chest discomfort that felt like a shock, would last for just a second.  Not related to activity.  States that she was having these yesterday morning and became lightheaded.  Felt like she was going to pass out.  Prompted her to present to the ED.  She reports she is very active, she walks multiple times per week, denies any exertional chest pain or dyspnea.  She denies any palpitations.  Echocardiogram in 2016 showed EF 60 to 65%, mitral valve prolapse with trivial MR.     Past Medical History:  Diagnosis Date   Atypical angina (HCC) 08/17/2005   Exercise stress test-EF74% ,normal perfusion   Atypical nevus 10/22/2007   minimal - left cheek   Cancer (HCC) 10/2017   left breast cancer   Dysrhythmia 02/09/2009   history of SVT ; Echo normal systolic and diastolic function ,EF=>55%   Hx of colonic polyps adenomas and ssp 10/09/2017   Mitral valve prolapse    history mild MVP   Osteoporosis     Past Surgical History:  Procedure Laterality Date   BREAST BIOPSY Right 1995   COLONOSCOPY     COLONOSCOPY  11/2017   DILATATION & CURETTAGE/HYSTEROSCOPY WITH MYOSURE N/A 02/13/2023   Procedure: DILATATION & CURETTAGE/HYSTEROSCOPY WITH MYOSURE;  Surgeon: D'Iorio, Hadassah LABOR, MD;  Location: [facility];  Service: Gynecology;  Laterality: N/A;   MASTECTOMY W/ SENTINEL NODE BIOPSY Left 12/06/2017   Procedure: MASTECTOMY WITH SENTINEL LYMPH NODE BIOPSY;  Surgeon: Curvin Deward MOULD, MD;  Location: [facility];  Service: General;  Laterality: Left;   OPERATIVE ULTRASOUND N/A 02/13/2023   Procedure: OPERATIVE ULTRASOUND;  Surgeon: D'Iorio, Hadassah LABOR, MD;  Location: [facility];  Service: Gynecology;  Laterality: N/A;   POLYPECTOMY     TONSILLECTOMY      Current Medications: Current Meds  Medication Sig   calcium elemental as carbonate (BARIATRIC TUMS ULTRA) 400 MG chewable tablet Chew 1 tablet by mouth daily.   Calcium-Vitamin D-Vitamin K (VIACTIV PO) Take by mouth.   cholecalciferol (VITAMIN D) 1000 UNITS tablet Take 1,000 Units by mouth daily.   Multiple Vitamin (MULTI-VITAMIN PO) Take by mouth.   TOPROL  XL 25 MG 24 hr tablet TAKE 1 &1/2 TABLETS BY MOUTH DAILY     Allergies:   Patient has no known allergies.   Social History   Socioeconomic History   Marital status: Married    Spouse name: Not on file   Number of children: 2   Years of education: Not on file   Highest education level: Not on file  Occupational History   Not on file  Tobacco Use   Smoking status: Never   Smokeless tobacco: Never  Vaping Use   Vaping  status: Never Used  Substance and Sexual Activity   Alcohol use: Yes    Comment: occas glass of wine   Drug use: Never   Sexual activity: Not on file  Other Topics Concern   Not on file  Social History Narrative   Not on file   Social Drivers of Health   Financial Resource Strain: Not on file  Food Insecurity: Not on file  Transportation Needs: Not on file  Physical Activity: Not on file  Stress: Not on file  Social Connections: Not on file     Family History: The patient's family history includes Heart disease in her father. There is no history of Colon cancer, Esophageal cancer, Pancreatic cancer, Rectal cancer, or  Stomach cancer.  ROS:   Please see the history of present illness.     All other systems reviewed and are negative.  EKGs/Labs/Other Studies Reviewed:    The following studies were reviewed today:   EKG:   11/19/2023: Sinus rhythm, rate 61  Recent Labs: 11/19/2023: BUN 20; Creatinine, Ser 0.92; Hemoglobin 14.9; Magnesium 2.0; Platelets 175; Potassium 4.1; Sodium 137; TSH 1.810  Recent Lipid Panel    Component Value Date/Time   CHOL 189 05/10/2014 0849   TRIG 57 05/10/2014 0849   HDL 75 05/10/2014 0849   CHOLHDL 2.5 05/10/2014 0849   VLDL 11 05/10/2014 0849   LDLCALC 103 (H) 05/10/2014 0849    Physical Exam:    VS:  BP 116/70 (BP Location: Left Arm, Patient Position: Sitting, Cuff Size: Normal)   Pulse 66   Resp 17   Ht 5' 5 (1.651 m)   Wt 145 lb (65.8 kg)   SpO2 96%   BMI 24.13 kg/m     Wt Readings from Last 3 Encounters:  11/20/23 145 lb (65.8 kg)  11/19/23 145 lb (65.8 kg)  05/31/23 145 lb (65.8 kg)     GEN:  Well nourished, well developed in no acute distress HEENT: Normal NECK: No JVD; No carotid bruits LYMPHATICS: No lymphadenopathy CARDIAC: RRR, no murmurs, rubs, gallops RESPIRATORY:  Clear to auscultation without rales, wheezing or rhonchi  ABDOMEN: Soft, non-tender, non-distended MUSCULOSKELETAL:  No edema; No deformity  SKIN: Warm and dry NEUROLOGIC:  Alert and oriented x 3 PSYCHIATRIC:  Normal affect   ASSESSMENT:    1. Near syncope   2. Mitral valve prolapse   3. Chest pain of uncertain etiology   4. SVT (supraventricular tachycardia) (HCC)    PLAN:    Near syncope: Unclear etiology.  Recommend ZIO x 2 weeks to evaluate for arrhythmia.  Check echocardiogram to evaluate for structural heart disease  Chest pain: Reports feeling like an electric shock on left side of chest that lasts for a second and resolves, unrelated to exertion.  Description suggest noncardiac chest pain, no ischemic workup recommended at this time  MVP: Echocardiogram in  2016 showed bilateral mitral valve prolapse, trivial mitral regurgitation.  Update echocardiogram as above  SVT: On Toprol -XL 37.5 mg daily.  Plan to check Zio patch as above  RTC in 6 months   Medication Adjustments/Labs and Tests Ordered: Current medicines are reviewed at length with the patient today.  Concerns regarding medicines are outlined above.  Orders Placed This Encounter  Procedures   LONG TERM MONITOR (3-14 DAYS)   ECHOCARDIOGRAM COMPLETE   No orders of the defined types were placed in this encounter.   Patient Instructions  Medication Instructions:  Your physician recommends that you continue on your current medications as directed.  Please refer to the Current Medication list given to you today. *If you need a refill on your cardiac medications before your next appointment, please call your pharmacy*  Lab Work: NONE  Testing/Procedures: Your physician has requested that you have an echocardiogram. Echocardiography is a painless test that uses sound waves to create images of your heart. It provides your doctor with information about the size and shape of your heart and how well your heart's chambers and valves are working. This procedure takes approximately one hour. There are no restrictions for this procedure. Please do NOT wear cologne, perfume, aftershave, or lotions (deodorant is allowed). Please arrive 15 minutes prior to your appointment time.  Please note: We ask at that you not bring children with you during ultrasound (echo/ vascular) testing. Due to room size and safety concerns, children are not allowed in the ultrasound rooms during exams. Our front office staff cannot provide observation of children in our lobby area while testing is being conducted. An adult accompanying a patient to their appointment will only be allowed in the ultrasound room at the discretion of the ultrasound technician under special circumstances. We apologize for any inconvenience.   12/13/2023 3:30 pm    2 WEEK ZIO-THIS WILL BE MAILED TO YOU   Follow-Up: At [facility], you and your health needs are our priority.  As part of our continuing mission to provide you with exceptional heart care, our providers are all part of one team.  This team includes your primary Cardiologist (physician) and Advanced Practice Providers or APPs (Physician Assistants and Nurse Practitioners) who all work together to provide you with the care you need, when you need it.  Your next appointment:   6 month(s) DR [facility] OR APP   We recommend signing up for the patient portal called MyChart.  Sign up information is provided on this After Visit Summary.  MyChart is used to connect with patients for Virtual Visits (Telemedicine).  Patients are able to view lab/test results, encounter notes, upcoming appointments, etc.  Non-urgent messages can be sent to your provider as well.   To learn more about what you can do with MyChart, go to [email or web address].   Other Instructions ZIO XT- Long Term Monitor Instructions  Your physician has requested you wear a ZIO patch monitor for 14 days.  This is a single patch monitor. [employer] supplies one patch monitor per enrollment. Additional stickers are not available. Please do not apply patch if you will be having a Nuclear Stress Test,  Echocardiogram, Cardiac CT, MRI, or Chest Xray during the period you would be wearing the  monitor. The patch cannot be worn during these tests. You cannot remove and re-apply the  ZIO XT patch monitor.  Your ZIO patch monitor will be mailed 3 day USPS to your address on file. It may take 3-5 days  to receive your monitor after you have been enrolled.  Once you have received your monitor, please review the enclosed instructions. Your monitor  has already been registered assigning a specific monitor serial # to you.  Billing and Patient Assistance Program Information  We have supplied [employer] with any of  your insurance information on file for billing purposes. [employer] offers a sliding scale Patient Assistance Program for patients that do not have  insurance, or whose insurance does not completely cover the cost of the ZIO monitor.  You must apply for the Patient Assistance Program to qualify for this discounted rate.  To apply, please call [employer]  at [phone], select option 4, select option 2, ask to apply for  Patient Assistance Program. Meredeth will ask your household income, and how many people  are in your household. They will quote your out-of-pocket cost based on that information.  [employer] will also be able to set up a 56-month, interest-free payment plan if needed.  Applying the monitor   Shave hair from upper left chest.  Hold abrader disc by orange tab. Rub abrader in 40 strokes over the upper left chest as  indicated in your monitor instructions.  Clean area with 4 enclosed alcohol pads. Let dry.  Apply patch as indicated in monitor instructions. Patch will be placed under collarbone on left  side of chest with arrow pointing upward.  Rub patch adhesive wings for 2 minutes. Remove white label marked 1. Remove the white  label marked 2. Rub patch adhesive wings for 2 additional minutes.  While looking in a mirror, press and release button in center of patch. A small green light will  flash 3-4 times. This will be your only indicator that the monitor has been turned on.  Do not shower for the first 24 hours. You may shower after the first 24 hours.  Press the button if you feel a symptom. You will hear a small click. Record Date, Time and  Symptom in the Patient Logbook.  When you are ready to remove the patch, follow instructions on the last 2 pages of Patient  Logbook. Stick patch monitor onto the last page of Patient Logbook.  Place Patient Logbook in the blue and white box. Use locking tab on box and tape box closed  securely. The blue and white box has prepaid postage  on it. Please place it in the mailbox as  soon as possible. Your physician should have your test results approximately 7 days after the  monitor has been mailed back to [facility].  Call [facility] Customer Care at [phone] if you have questions regarding  your ZIO XT patch monitor. Call them immediately if you see an orange light blinking on your  monitor.  If your monitor falls off in less than 4 days, contact our Monitor department at [phone].  If your monitor becomes loose or falls off after 4 days call [employer] at [phone] for  suggestions on securing your monitor       Signed, Lonni LITTIE Nanas, MD  11/20/2023 5:42 PM    [facility]

## 2023-12-05 DIAGNOSIS — Z1231 Encounter for screening mammogram for malignant neoplasm of breast: Secondary | ICD-10-CM | POA: Diagnosis not present

## 2023-12-06 ENCOUNTER — Telehealth: Payer: Self-pay | Admitting: Adult Health

## 2023-12-06 ENCOUNTER — Inpatient Hospital Stay: Payer: Medicare Other | Admitting: Adult Health

## 2023-12-06 NOTE — Telephone Encounter (Signed)
 left vm for patient about rescheduled appt date and time

## 2023-12-12 ENCOUNTER — Encounter: Payer: Self-pay | Admitting: Adult Health

## 2023-12-13 ENCOUNTER — Inpatient Hospital Stay: Attending: Adult Health | Admitting: Adult Health

## 2023-12-13 ENCOUNTER — Ambulatory Visit (INDEPENDENT_AMBULATORY_CARE_PROVIDER_SITE_OTHER)

## 2023-12-13 ENCOUNTER — Encounter: Payer: Self-pay | Admitting: Adult Health

## 2023-12-13 VITALS — BP 123/82 | HR 65 | Temp 97.5°F | Resp 16 | Wt 143.9 lb

## 2023-12-13 DIAGNOSIS — M810 Age-related osteoporosis without current pathological fracture: Secondary | ICD-10-CM | POA: Diagnosis not present

## 2023-12-13 DIAGNOSIS — C50812 Malignant neoplasm of overlapping sites of left female breast: Secondary | ICD-10-CM | POA: Diagnosis not present

## 2023-12-13 DIAGNOSIS — I341 Nonrheumatic mitral (valve) prolapse: Secondary | ICD-10-CM | POA: Diagnosis not present

## 2023-12-13 DIAGNOSIS — Z9012 Acquired absence of left breast and nipple: Secondary | ICD-10-CM | POA: Diagnosis not present

## 2023-12-13 DIAGNOSIS — Z170 Estrogen receptor positive status [ER+]: Secondary | ICD-10-CM | POA: Insufficient documentation

## 2023-12-13 DIAGNOSIS — Z79810 Long term (current) use of selective estrogen receptor modulators (SERMs): Secondary | ICD-10-CM | POA: Diagnosis not present

## 2023-12-13 DIAGNOSIS — M419 Scoliosis, unspecified: Secondary | ICD-10-CM | POA: Diagnosis not present

## 2023-12-13 LAB — ECHOCARDIOGRAM COMPLETE
AR max vel: 1.74 cm2
AV Area VTI: 1.93 cm2
AV Area mean vel: 1.82 cm2
AV Mean grad: 2 mmHg
AV Peak grad: 4.8 mmHg
Ao pk vel: 1.1 m/s
Area-P 1/2: 3.08 cm2
S' Lateral: 2.7 cm
Weight: 2302.4 [oz_av]

## 2023-12-13 NOTE — Progress Notes (Signed)
 [facility] Cancer Follow up:    Claudene Pellet, MD [phone] [address] [place] [address]   DIAGNOSIS: Cancer Staging  Malignant neoplasm of overlapping sites of left breast in female, estrogen receptor positive (HCC) Staging form: Breast, AJCC 8th Edition - Clinical stage from 11/06/2017: Stage 0 (cTis (DCIS), cN0, cM0, ER+, PR+) - Unsigned Laterality: Left Staged by: Pathologist and managing physician Stage used in treatment planning: Yes National guidelines used in treatment planning: Yes Type of national guideline used in treatment planning: NCCN - Pathologic: Stage IA (pT1b, pN0, cM0, G2, ER+, PR+, HER2-) - Signed by Crawford Morna Pickle, NP on 12/18/2017 Histologic grading system: 3 grade system    SUMMARY OF ONCOLOGIC HISTORY: Oncology History  Malignant neoplasm of overlapping sites of left breast in female, estrogen receptor positive (HCC)  12/06/2017 Surgery    status post left mastectomy and sentinel lymph node sampling 12/06/2017 for a pT1b pN0, stage IA invasive lobular carcinoma, grade 2, estrogen and progesterone receptor positive, HER-2 not amplified   12/18/2017 Cancer Staging   Staging form: Breast, AJCC 8th Edition - Pathologic: Stage IA (pT1b, pN0, cM0, G2, ER+, PR+, HER2-) - Signed by Crawford Morna Pickle, NP on 12/18/2017   12/20/2017 -  Anti-estrogen oral therapy   Tamoxifen  daily--bone density on 08/16/2014 shows osteoporosis.       CURRENT THERAPY: observation  INTERVAL HISTORY:  Discussed the use of AI scribe software for clinical note transcription with the patient, who gave verbal consent to proceed.  History of Present Illness [name] is an 78 year old female with a history of stage one ER-positive breast cancer who presents for continued long-term follow-up.  She was diagnosed in 2019, underwent mastectomy followed by tamoxifen  x 5 years.  A week after discontinuing tamoxifen , she experienced unexpected  bleeding, leading to a D&C procedure where a polyp was removed. Since stopping tamoxifen , she notices hair thinning and increased wrinkles. She reports no new pain, unintentional weight loss, or new spots of concern. A dermatology appointment is scheduled for next week.  She continues on observation alone with her most recent mammogram occurring on 12/05/2023 demonstrating no mammographic evidence of malignancy.       Patient Active Problem List   Diagnosis Date Noted   Osteoporosis 11/27/2018   Malignant neoplasm of overlapping sites of left breast in female, estrogen receptor positive (HCC) 10/29/2017   Hx of colonic polyps adenomas and ssp 10/09/2017   Mitral valve prolapse 06/12/2014   Total bilirubin, elevated 06/12/2014   Chest pain 05/07/2014   Palpitations 05/07/2014   Paroxysmal supraventricular tachycardia 07/02/2013   Mitral regurgitation 07/02/2013    has no known allergies.  MEDICAL HISTORY: Past Medical History:  Diagnosis Date   Atypical angina 08/17/2005   Exercise stress test-EF74% ,normal perfusion   Atypical nevus 10/22/2007   minimal - left cheek   Cancer (HCC) 10/2017   left breast cancer   Dysrhythmia 02/09/2009   history of SVT ; Echo normal systolic and diastolic function ,EF=>55%   Hx of colonic polyps adenomas and ssp 10/09/2017   Mitral valve prolapse    history mild MVP   Osteoporosis     SURGICAL HISTORY: Past Surgical History:  Procedure Laterality Date   BREAST BIOPSY Right 1995   COLONOSCOPY     COLONOSCOPY  11/2017   DILATATION & CURETTAGE/HYSTEROSCOPY WITH MYOSURE N/A 02/13/2023   Procedure: DILATATION & CURETTAGE/HYSTEROSCOPY WITH MYOSURE;  Surgeon: D'Iorio, Hadassah LABOR, MD;  Location: [facility];  Service: Gynecology;  Laterality: N/A;   MASTECTOMY W/ SENTINEL NODE BIOPSY Left 12/06/2017   Procedure: MASTECTOMY WITH SENTINEL LYMPH NODE BIOPSY;  Surgeon: Curvin Deward MOULD, MD;  Location:  SURGERY CENTER;  Service:  General;  Laterality: Left;   OPERATIVE ULTRASOUND N/A 02/13/2023   Procedure: OPERATIVE ULTRASOUND;  Surgeon: D'Iorio, Hadassah LABOR, MD;  Location: [facility];  Service: Gynecology;  Laterality: N/A;   POLYPECTOMY     TONSILLECTOMY      SOCIAL HISTORY: Social History   Socioeconomic History   Marital status: Married    Spouse name: Not on file   Number of children: 2   Years of education: Not on file   Highest education level: Not on file  Occupational History   Not on file  Tobacco Use   Smoking status: Never   Smokeless tobacco: Never  Vaping Use   Vaping status: Never Used  Substance and Sexual Activity   Alcohol use: Yes    Comment: occas glass of wine   Drug use: Never   Sexual activity: Not on file  Other Topics Concern   Not on file  Social History Narrative   Not on file   Social Drivers of Health   Financial Resource Strain: Not on file  Food Insecurity: Not on file  Transportation Needs: Not on file  Physical Activity: Not on file  Stress: Not on file  Social Connections: Not on file  Intimate Partner Violence: Not on file    FAMILY HISTORY: Family History  Problem Relation Age of Onset   Heart disease Father    Colon cancer Neg Hx    Esophageal cancer Neg Hx    Pancreatic cancer Neg Hx    Rectal cancer Neg Hx    Stomach cancer Neg Hx     Review of Systems  Constitutional:  Negative for appetite change, chills, fatigue, fever and unexpected weight change.  HENT:   Negative for hearing loss, lump/mass and trouble swallowing.   Eyes:  Negative for eye problems and icterus.  Respiratory:  Negative for chest tightness, cough and shortness of breath.   Cardiovascular:  Negative for chest pain, leg swelling and palpitations.  Gastrointestinal:  Negative for abdominal distention, abdominal pain, constipation, diarrhea, nausea and vomiting.  Endocrine: Negative for hot flashes.  Genitourinary:  Negative for difficulty urinating.    Musculoskeletal:  Negative for arthralgias.  Skin:  Negative for itching and rash.  Neurological:  Negative for dizziness, extremity weakness, headaches and numbness.  Hematological:  Negative for adenopathy. Does not bruise/bleed easily.  Psychiatric/Behavioral:  Negative for depression. The patient is not nervous/anxious.       PHYSICAL EXAMINATION   Onc Performance Status - 12/13/23 1134       ECOG Perf Status   ECOG Perf Status Fully active, able to carry on all pre-disease performance without restriction      KPS SCALE   KPS % SCORE Able to carry on normal activity, minor s/s of disease          Vitals:   12/13/23 1130  BP: 123/82  Pulse: 65  Resp: 16  Temp: (!) 97.5 F (36.4 C)  SpO2: 99%    Physical Exam Constitutional:      General: She is not in acute distress.    Appearance: Normal appearance. She is not toxic-appearing.  HENT:     Head: Normocephalic and atraumatic.     Mouth/Throat:     Mouth: Mucous membranes are  moist.     Pharynx: Oropharynx is clear. No oropharyngeal exudate or posterior oropharyngeal erythema.  Eyes:     General: No scleral icterus. Cardiovascular:     Rate and Rhythm: Normal rate and regular rhythm.     Pulses: Normal pulses.     Heart sounds: Normal heart sounds.  Pulmonary:     Effort: Pulmonary effort is normal.     Breath sounds: Normal breath sounds.  Chest:     Comments: Left breast s/p mastectomy, no sign of local recurrence, right breast benign Abdominal:     General: Abdomen is flat. Bowel sounds are normal. There is no distension.     Palpations: Abdomen is soft.     Tenderness: There is no abdominal tenderness.  Musculoskeletal:        General: No swelling.     Cervical back: Neck supple.  Lymphadenopathy:     Cervical: No cervical adenopathy.     Upper Body:     Right upper body: No supraclavicular or axillary adenopathy.     Left upper body: No supraclavicular or axillary adenopathy.  Skin:    General:  Skin is warm and dry.     Findings: No rash.  Neurological:     General: No focal deficit present.     Mental Status: She is alert.  Psychiatric:        Mood and Affect: Mood normal.        Behavior: Behavior normal.     ASSESSMENT and THERAPY PLAN:   Assessment and Plan Assessment & Plan Stage I estrogen receptor-positive breast cancer, status postmastectomy and 5 years of adjuvant antiestrogen therapy with tamoxifen .  No clinical or radiographic signs of breast cancer recurrence.   - Continue annual mammogram screenings. - Maintain annual follow-up visits. - Report new or unusual symptoms immediately. - Ensure regular primary care and dermatology visits. -Encouraged diet rich in fruits and vegetables.   - Encouraged regular exercise.    RTC in 1 year for continued long term follow up and surveillance.     All questions were answered. The patient knows to call the clinic with any problems, questions or concerns. We can certainly see the patient much sooner if necessary.  Total encounter time:30 minutes*in face-to-face visit time, chart review, lab review, care coordination, order entry, and documentation of the encounter time.    Morna Kendall, NP 12/13/23 3:30 PM Medical Oncology and Hematology [facility] [address] [place], [place] [address] Tel. [phone]    Fax. [phone]  *Total Encounter Time as defined by the Centers for Medicare and Medicaid Services includes, in addition to the face-to-face time of a patient visit (documented in the note above) non-face-to-face time: obtaining and reviewing outside history, ordering and reviewing medications, tests or procedures, care coordination (communications with other health care professionals or caregivers) and documentation in the medical record.

## 2023-12-16 ENCOUNTER — Ambulatory Visit: Payer: Self-pay | Admitting: Cardiology

## 2023-12-19 DIAGNOSIS — L578 Other skin changes due to chronic exposure to nonionizing radiation: Secondary | ICD-10-CM | POA: Diagnosis not present

## 2023-12-19 DIAGNOSIS — D489 Neoplasm of uncertain behavior, unspecified: Secondary | ICD-10-CM | POA: Diagnosis not present

## 2023-12-19 DIAGNOSIS — D1801 Hemangioma of skin and subcutaneous tissue: Secondary | ICD-10-CM | POA: Diagnosis not present

## 2023-12-19 DIAGNOSIS — L821 Other seborrheic keratosis: Secondary | ICD-10-CM | POA: Diagnosis not present

## 2023-12-19 DIAGNOSIS — L814 Other melanin hyperpigmentation: Secondary | ICD-10-CM | POA: Diagnosis not present

## 2023-12-19 DIAGNOSIS — D229 Melanocytic nevi, unspecified: Secondary | ICD-10-CM | POA: Diagnosis not present

## 2023-12-19 DIAGNOSIS — L570 Actinic keratosis: Secondary | ICD-10-CM | POA: Diagnosis not present

## 2024-01-06 DIAGNOSIS — Z23 Encounter for immunization: Secondary | ICD-10-CM | POA: Diagnosis not present

## 2024-01-13 DIAGNOSIS — R55 Syncope and collapse: Secondary | ICD-10-CM | POA: Diagnosis not present

## 2024-01-15 DIAGNOSIS — Z23 Encounter for immunization: Secondary | ICD-10-CM | POA: Diagnosis not present

## 2024-01-18 DIAGNOSIS — R55 Syncope and collapse: Secondary | ICD-10-CM

## 2024-01-23 ENCOUNTER — Telehealth: Payer: Self-pay | Admitting: Cardiology

## 2024-01-23 NOTE — Telephone Encounter (Signed)
 Patient called to follow-up on hear monitor test results.

## 2024-01-23 NOTE — Telephone Encounter (Signed)
I spoke with patient and reviewed monitor results with her.  

## 2024-05-21 ENCOUNTER — Ambulatory Visit: Admitting: Cardiology

## 2024-12-11 ENCOUNTER — Encounter: Admitting: Adult Health
